# Patient Record
Sex: Male | Born: 1935 | Race: White | Hispanic: No | Marital: Married | Smoking: Former smoker
Health system: Southern US, Community
[De-identification: ages and names within clinical notes are randomized; demographics above are authoritative.]

## PROBLEM LIST (undated history)

## (undated) DIAGNOSIS — J449 Chronic obstructive pulmonary disease, unspecified: Secondary | ICD-10-CM

## (undated) DIAGNOSIS — E785 Hyperlipidemia, unspecified: Secondary | ICD-10-CM

## (undated) DIAGNOSIS — N189 Chronic kidney disease, unspecified: Secondary | ICD-10-CM

## (undated) DIAGNOSIS — E119 Type 2 diabetes mellitus without complications: Secondary | ICD-10-CM

## (undated) DIAGNOSIS — N183 Chronic kidney disease, stage 3 unspecified: Secondary | ICD-10-CM

## (undated) DIAGNOSIS — I10 Essential (primary) hypertension: Secondary | ICD-10-CM

## (undated) DIAGNOSIS — I639 Cerebral infarction, unspecified: Secondary | ICD-10-CM

## (undated) DIAGNOSIS — I4891 Unspecified atrial fibrillation: Secondary | ICD-10-CM

## (undated) DIAGNOSIS — N400 Enlarged prostate without lower urinary tract symptoms: Secondary | ICD-10-CM

## (undated) DIAGNOSIS — J984 Other disorders of lung: Secondary | ICD-10-CM

## (undated) DIAGNOSIS — I429 Cardiomyopathy, unspecified: Secondary | ICD-10-CM

## (undated) DIAGNOSIS — I2510 Atherosclerotic heart disease of native coronary artery without angina pectoris: Secondary | ICD-10-CM

## (undated) DIAGNOSIS — E11319 Type 2 diabetes mellitus with unspecified diabetic retinopathy without macular edema: Secondary | ICD-10-CM

## (undated) DIAGNOSIS — E1122 Type 2 diabetes mellitus with diabetic chronic kidney disease: Secondary | ICD-10-CM

## (undated) HISTORY — DX: Essential (primary) hypertension: I10

## (undated) HISTORY — DX: Cardiomyopathy, unspecified: I42.9

## (undated) HISTORY — DX: Chronic kidney disease, stage 3 (moderate): N18.3

## (undated) HISTORY — DX: Type 2 diabetes mellitus without complications: E11.9

## (undated) HISTORY — PX: CARDIAC DEFIBRILLATOR PLACEMENT: SHX171

## (undated) HISTORY — PX: APPENDECTOMY: SHX54

## (undated) HISTORY — DX: Chronic kidney disease, unspecified: N18.9

## (undated) HISTORY — PX: PACEMAKER INSERTION: SHX728

## (undated) HISTORY — PX: CORONARY ARTERY BYPASS GRAFT: SHX141

## (undated) HISTORY — DX: Hyperlipidemia, unspecified: E78.5

## (undated) HISTORY — DX: Cerebral infarction, unspecified: I63.9

## (undated) HISTORY — DX: Unspecified atrial fibrillation: I48.91

## (undated) HISTORY — DX: Chronic kidney disease, stage 3 unspecified: N18.30

## (undated) HISTORY — DX: Other disorders of lung: J98.4

## (undated) HISTORY — PX: VASECTOMY: SHX75

## (undated) HISTORY — DX: Chronic obstructive pulmonary disease, unspecified: J44.9

## (undated) HISTORY — DX: Atherosclerotic heart disease of native coronary artery without angina pectoris: I25.10

## (undated) HISTORY — DX: Type 2 diabetes mellitus with diabetic chronic kidney disease: E11.22

---

## 2004-04-09 ENCOUNTER — Ambulatory Visit: Payer: Self-pay | Admitting: Cardiology

## 2005-10-14 ENCOUNTER — Other Ambulatory Visit: Payer: Self-pay

## 2005-10-14 ENCOUNTER — Emergency Department: Payer: Self-pay | Admitting: Emergency Medicine

## 2007-09-28 ENCOUNTER — Ambulatory Visit: Payer: Self-pay | Admitting: Gastroenterology

## 2008-01-05 ENCOUNTER — Ambulatory Visit: Payer: Self-pay | Admitting: Family Medicine

## 2009-09-05 ENCOUNTER — Ambulatory Visit: Payer: Self-pay

## 2009-10-10 ENCOUNTER — Ambulatory Visit: Payer: Self-pay

## 2010-06-26 ENCOUNTER — Ambulatory Visit: Payer: Self-pay | Admitting: Gastroenterology

## 2010-10-29 ENCOUNTER — Ambulatory Visit: Payer: Self-pay | Admitting: Gastroenterology

## 2011-01-30 IMAGING — CT CT CHEST W/ CM
1 series · 15 of 33 positions shown, 19 images · non-contrast
Comparison: none

REASON FOR EXAM: ABNORMAL CHEST XRAY
COMMENTS:

[Series 2: soft tissue · axial · 0.90mm/px · z∈[-162,+143]mm · 15 of 73 slices shown, 19 images]
[im 6/73  mediastinal]
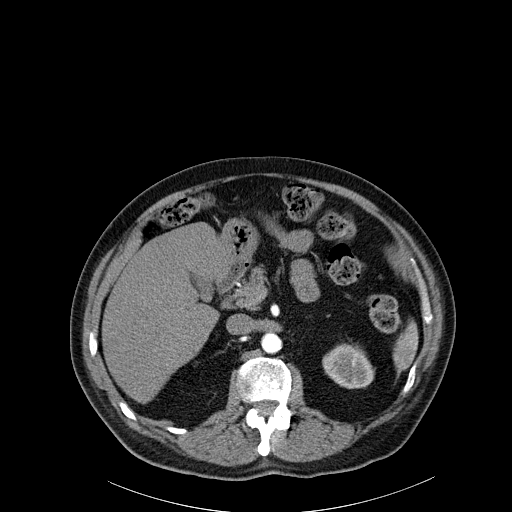
[im 6/73  lung]
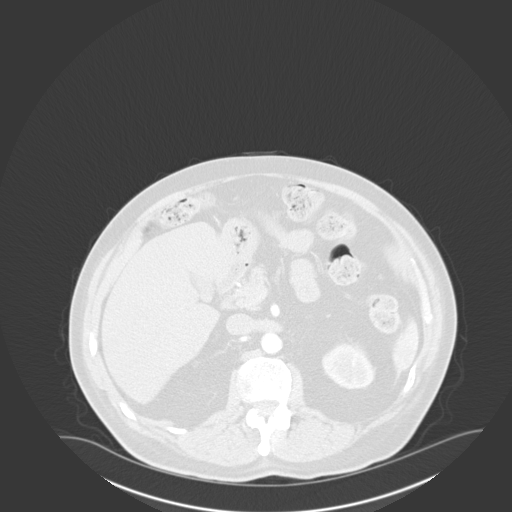
[im 11/73  lung]
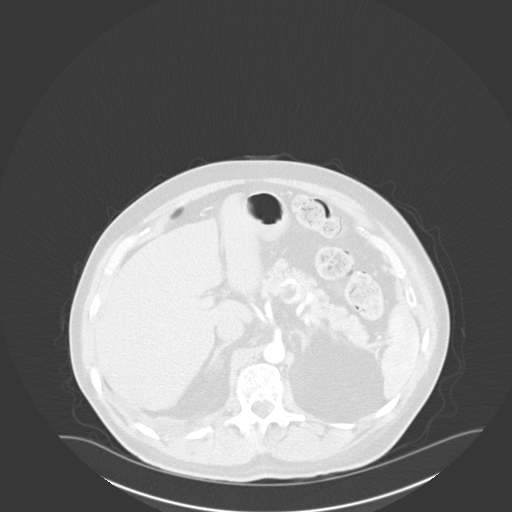
[im 15/73  lung]
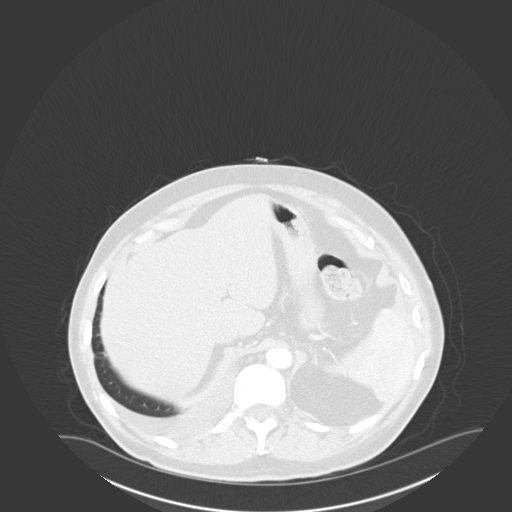
[im 19/73  lung]
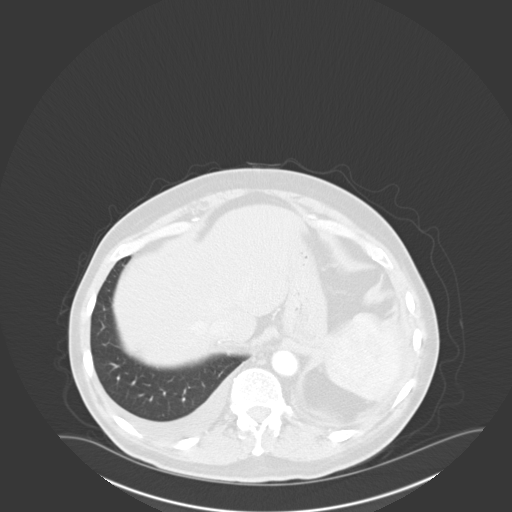
[im 25/73  mediastinal]
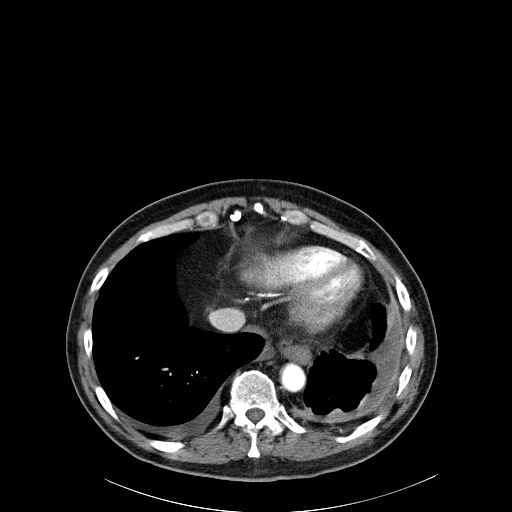
[im 25/73  lung]
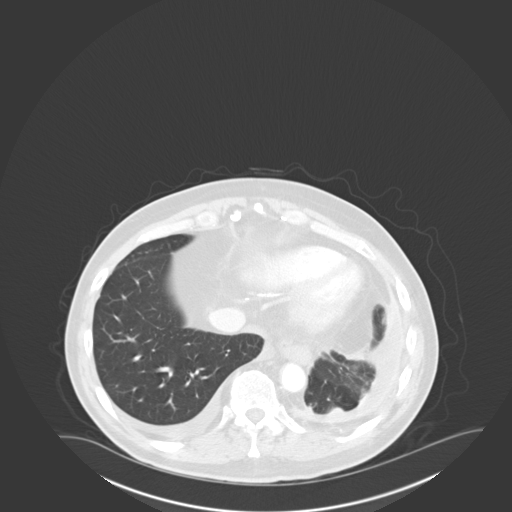
[im 29/73  lung]
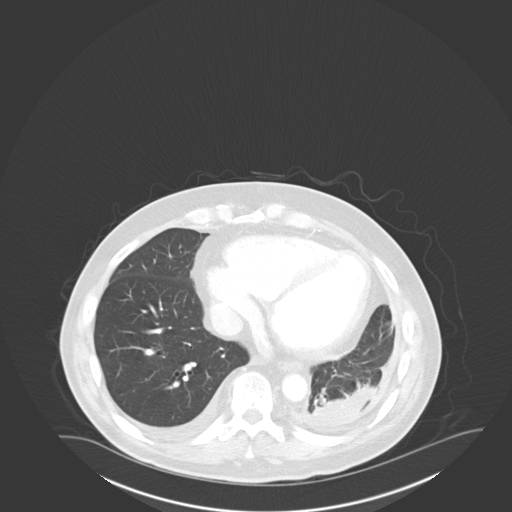
[im 33/73  lung]
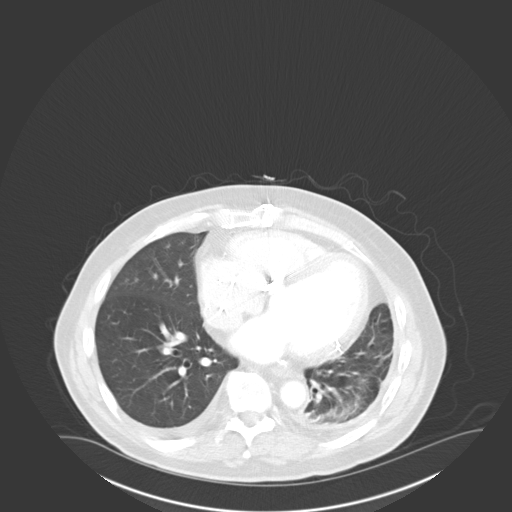
[im 38/73  lung]
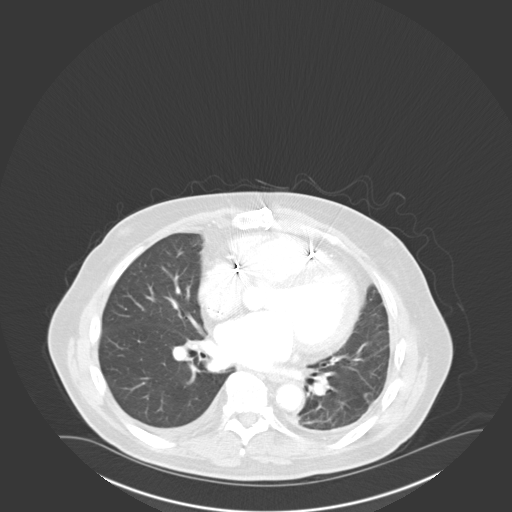
[im 41/73  mediastinal]
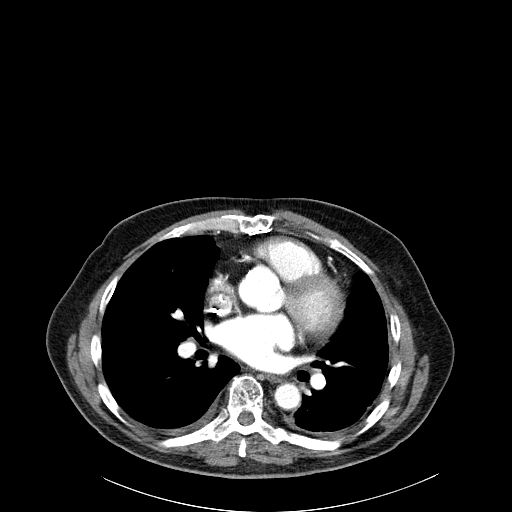
[im 41/73  lung]
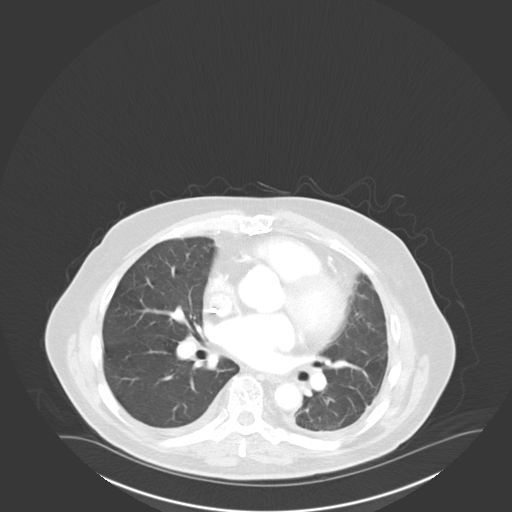
[im 44/73  lung]
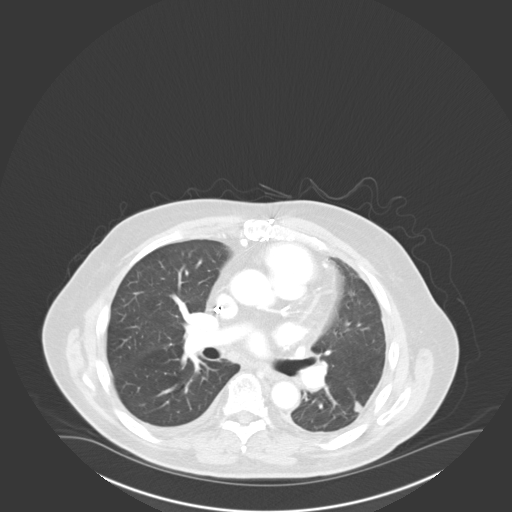
[im 49/73  lung]
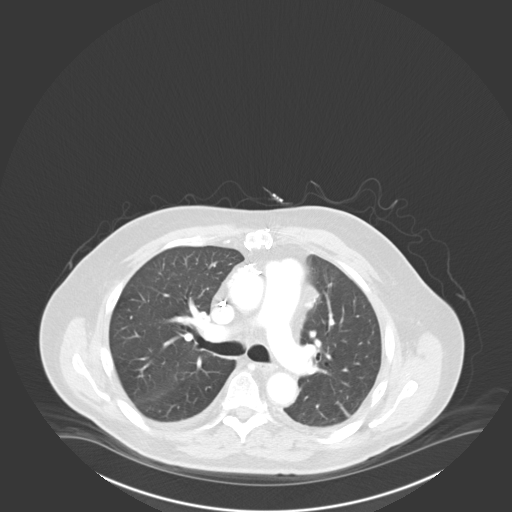
[im 54/73  lung]
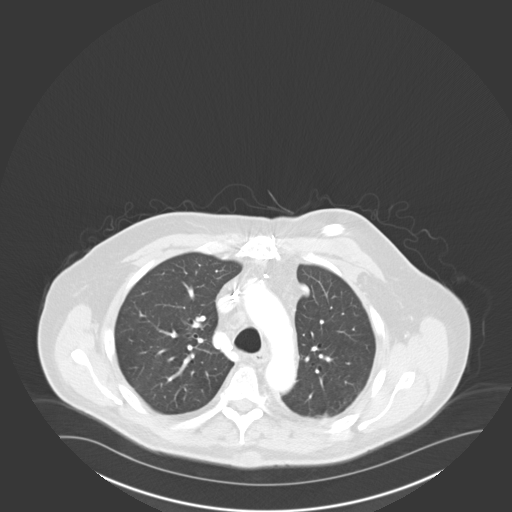
[im 58/73  mediastinal]
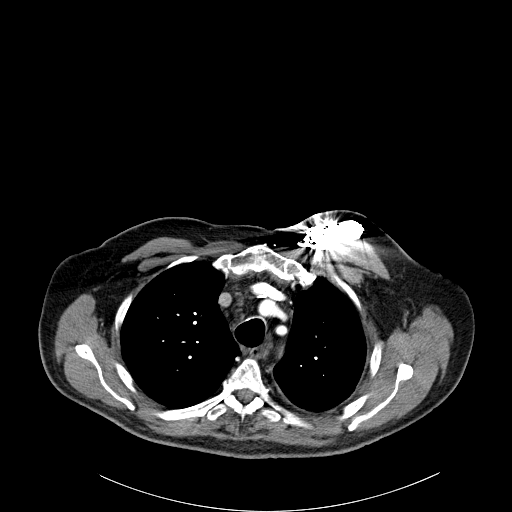
[im 58/73  lung]
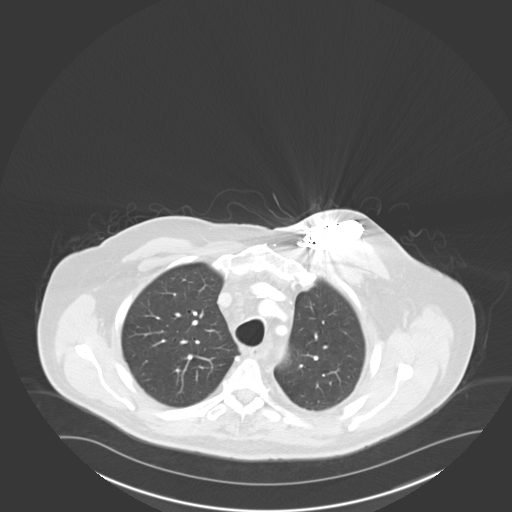
[im 62/73  lung]
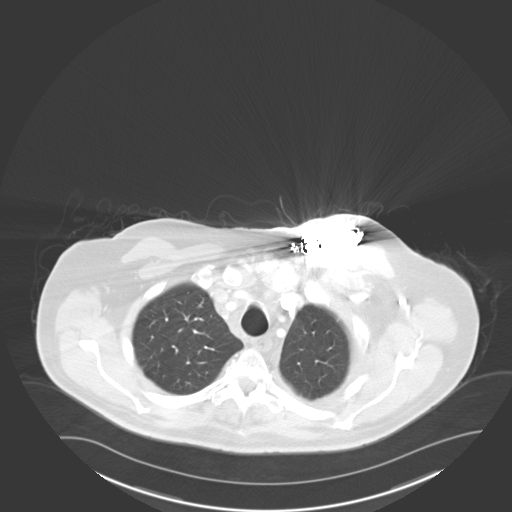
[im 67/73  lung]
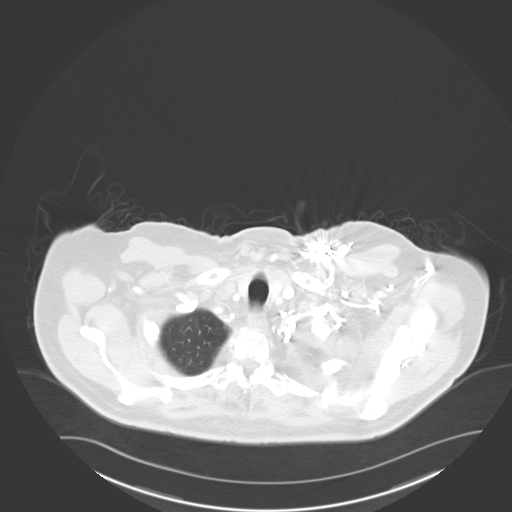

[15 of 33 positions shown; findings below may reference images not displayed]

PROCEDURE:     CT  - CT CHEST WITH CONTRAST  - September 05, 2009 [DATE]

RESULT:     Axial CT scanning was performed through the chest at 5 mm
intervals and slice thicknesses. The patient received 75 cc of Usovue-JP1.
Comparison is made to the report of the [HOSPITAL] chest x-ray 29 August, 2009.

There is abnormal soft tissue density material along the lateral and
posterior pleural surfaces at the left lung base. This suggests
consolidation within the periphery of the left lower lobe with a small
amount of adjacent pleural fluid. An area of pleural based nodular density
is seen on image 30 in the left lower lobe. This measures no more than 1 cm
in greatest dimension. The left upper lobe is clear. The right lung is
adequately inflated and clear. There is a small right pleural effusion
layering posteriorly however.

The cardiac chambers are enlarged. There is a permanent pacemaker in place.
The caliber of the thoracic aorta is normal. There are are a few borderline
enlarged precarinal and AP window lymph nodes identified but there is no
bulky lymphadenopathy.

Within the upper abdomen the observed portions of the liver and gallbladder
appear normal. The spleen is not enlarged. The thoracic vertebral bodies are
preserved in height.
IMPRESSION: 1. There is parenchymal consolidation peripherally in the left lower lobe
with a small left pleural effusion. There is a somewhat larger amount of
pleural fluid layering posteriorly on the right without adjacent pulmonary
parenchymal abnormality.
2. There are a few borderline enlarged mediastinal lymph nodes present.
3. The cardiac chambers are enlarged. There is no pericardial effusion nor
overt evidence of pulmonary interstitial edema. The findings at the left
lung base are likely related to an infectious process but followup imaging
will be needed to assure clearing following therapy. Neoplasm cannot be
dogmatically excluded based on this study.

## 2013-03-31 ENCOUNTER — Other Ambulatory Visit: Payer: Self-pay | Admitting: Family Medicine

## 2014-08-20 ENCOUNTER — Other Ambulatory Visit: Payer: Self-pay | Admitting: Family Medicine

## 2014-08-20 ENCOUNTER — Ambulatory Visit
Admission: RE | Admit: 2014-08-20 | Discharge: 2014-08-20 | Disposition: A | Discharge status: Home / Self Care | Payer: Medicare Other | Source: Ambulatory Visit | Attending: Family Medicine | Admitting: Family Medicine

## 2014-08-20 DIAGNOSIS — R0602 Shortness of breath: Secondary | ICD-10-CM | POA: Diagnosis present

## 2014-08-20 DIAGNOSIS — R059 Cough, unspecified: Secondary | ICD-10-CM

## 2014-08-20 DIAGNOSIS — R0689 Other abnormalities of breathing: Secondary | ICD-10-CM

## 2014-08-20 DIAGNOSIS — R05 Cough: Secondary | ICD-10-CM

## 2014-08-20 DIAGNOSIS — R0989 Other specified symptoms and signs involving the circulatory and respiratory systems: Secondary | ICD-10-CM | POA: Insufficient documentation

## 2014-08-20 DIAGNOSIS — I509 Heart failure, unspecified: Secondary | ICD-10-CM | POA: Insufficient documentation

## 2014-09-28 DIAGNOSIS — I429 Cardiomyopathy, unspecified: Secondary | ICD-10-CM | POA: Insufficient documentation

## 2014-09-28 DIAGNOSIS — E1122 Type 2 diabetes mellitus with diabetic chronic kidney disease: Secondary | ICD-10-CM | POA: Insufficient documentation

## 2014-09-28 DIAGNOSIS — I2510 Atherosclerotic heart disease of native coronary artery without angina pectoris: Secondary | ICD-10-CM | POA: Insufficient documentation

## 2014-09-28 DIAGNOSIS — I10 Essential (primary) hypertension: Secondary | ICD-10-CM | POA: Insufficient documentation

## 2014-09-28 DIAGNOSIS — N183 Chronic kidney disease, stage 3 unspecified: Secondary | ICD-10-CM | POA: Insufficient documentation

## 2014-09-28 DIAGNOSIS — J984 Other disorders of lung: Secondary | ICD-10-CM | POA: Insufficient documentation

## 2014-09-28 DIAGNOSIS — I4891 Unspecified atrial fibrillation: Secondary | ICD-10-CM | POA: Insufficient documentation

## 2014-09-28 DIAGNOSIS — E785 Hyperlipidemia, unspecified: Secondary | ICD-10-CM | POA: Insufficient documentation

## 2014-09-29 ENCOUNTER — Other Ambulatory Visit: Payer: Self-pay | Admitting: Family Medicine

## 2014-10-01 ENCOUNTER — Encounter: Payer: Self-pay | Admitting: Family Medicine

## 2014-10-01 ENCOUNTER — Ambulatory Visit (INDEPENDENT_AMBULATORY_CARE_PROVIDER_SITE_OTHER): Payer: Medicare Other | Admitting: Family Medicine

## 2014-10-01 VITALS — BP 106/67 | HR 60 | Temp 98.5°F | Ht 71.0 in | Wt 193.0 lb

## 2014-10-01 DIAGNOSIS — N183 Chronic kidney disease, stage 3 unspecified: Secondary | ICD-10-CM

## 2014-10-01 DIAGNOSIS — E1122 Type 2 diabetes mellitus with diabetic chronic kidney disease: Secondary | ICD-10-CM

## 2014-10-01 DIAGNOSIS — I2510 Atherosclerotic heart disease of native coronary artery without angina pectoris: Secondary | ICD-10-CM

## 2014-10-01 DIAGNOSIS — I10 Essential (primary) hypertension: Secondary | ICD-10-CM | POA: Diagnosis not present

## 2014-10-01 DIAGNOSIS — I2583 Coronary atherosclerosis due to lipid rich plaque: Principal | ICD-10-CM

## 2014-10-01 NOTE — Assessment & Plan Note (Signed)
After stopping toujeo doing well Will follow glu and call if problems

## 2014-10-01 NOTE — Assessment & Plan Note (Signed)
Reviewed and  The current medical regimen is effective;  continue present plan and medications.

## 2014-10-01 NOTE — Assessment & Plan Note (Signed)
The current medical regimen is effective;  continue present plan and medications.  

## 2014-10-01 NOTE — Progress Notes (Signed)
BP 106/67 mmHg  Pulse 60  Temp(Src) 98.5 F (36.9 C)  Ht 5\' 11"  (1.803 m)  Wt 193 lb (87.544 kg)  BMI 26.93 kg/m2  SpO2 99%   Subjective:    Patient ID: Matthew [name], male    DOB: 1936/03/22, 79 y.o.   MRN: [id number]  HPI: SAMVIT PEOT is a 79 y.o. male  Chief Complaint  Patient presents with  . Diabetes    toujeo issues, patient has stopped taking  pt was gaining wt until stopped toujeo fasting glu has slightly gone up and wt has lost 18 lbs over last 2 weeks record and reviewed on daily log. Pt feeling better Lipids and BP stable Other meds no side effects feeling well Takes every day  Relevant past medical, surgical, family and social history reviewed and updated as indicated. Interim medical history since our last visit reviewed. Allergies and medications reviewed and updated.  Review of Systems  Respiratory: Negative.   Cardiovascular: Negative.     Per HPI unless specifically indicated above     Objective:    BP 106/67 mmHg  Pulse 60  Temp(Src) 98.5 F (36.9 C)  Ht 5\' 11"  (1.803 m)  Wt 193 lb (87.544 kg)  BMI 26.93 kg/m2  SpO2 99%  Wt Readings from Last 3 Encounters:  10/01/14 193 lb (87.544 kg)  08/29/14 209 lb (94.802 kg)    Physical Exam  Constitutional: He is oriented to person, place, and time. He appears well-developed and well-nourished. No distress.  HENT:  Head: Normocephalic and atraumatic.  Right Ear: Hearing normal.  Left Ear: Hearing normal.  Nose: Nose normal.  Eyes: Conjunctivae and lids are normal. Right eye exhibits no discharge. Left eye exhibits no discharge. No scleral icterus.  Cardiovascular: Normal rate and regular rhythm.   Pulmonary/Chest: Effort normal and breath sounds normal. No respiratory distress.  Musculoskeletal: Normal range of motion. He exhibits no edema.  Neurological: He is alert and oriented to person, place, and time.  Skin: Skin is intact. No rash noted.  Psychiatric: He has a normal mood and  affect. His speech is normal and behavior is normal. Judgment and thought content normal. Cognition and memory are normal.        Assessment & Plan:   Problem List Items Addressed This Visit      Cardiovascular and Mediastinum   CAD (coronary artery disease) - Primary    Reviewed and  The current medical regimen is effective;  continue present plan and medications.       Relevant Medications   vitamin C (ASCORBIC ACID) 500 MG tablet   aspirin EC 81 MG tablet   Cholecalciferol (VITAMIN D-1000 MAX ST) 1000 UNITS tablet   vitamin B-12 (CYANOCOBALAMIN) 100 MCG tablet   ferrous sulfate 325 (65 FE) MG tablet   Saw Palmetto 160 MG CAPS   vitamin E 400 UNIT capsule   Hypertension    The current medical regimen is effective;  continue present plan and medications.       Relevant Medications   vitamin C (ASCORBIC ACID) 500 MG tablet   aspirin EC 81 MG tablet   Cholecalciferol (VITAMIN D-1000 MAX ST) 1000 UNITS tablet   vitamin B-12 (CYANOCOBALAMIN) 100 MCG tablet   ferrous sulfate 325 (65 FE) MG tablet   Saw Palmetto 160 MG CAPS   vitamin E 400 UNIT capsule     Endocrine   Type 2 diabetes mellitus with stage 3 chronic kidney disease    After stopping  toujeo doing well Will follow glu and call if problems      Relevant Medications   vitamin C (ASCORBIC ACID) 500 MG tablet   aspirin EC 81 MG tablet   Cholecalciferol (VITAMIN D-1000 MAX ST) 1000 UNITS tablet   vitamin B-12 (CYANOCOBALAMIN) 100 MCG tablet   ferrous sulfate 325 (65 FE) MG tablet   Saw Palmetto 160 MG CAPS   vitamin E 400 UNIT capsule       Follow up plan: Return in about 3 months (around 01/01/2015) for a1c and DM check.

## 2014-10-15 ENCOUNTER — Other Ambulatory Visit: Payer: Self-pay | Admitting: Family Medicine

## 2014-10-29 ENCOUNTER — Other Ambulatory Visit: Payer: Self-pay | Admitting: Family Medicine

## 2014-11-20 ENCOUNTER — Encounter: Payer: Self-pay | Admitting: Family Medicine

## 2014-11-20 ENCOUNTER — Ambulatory Visit (INDEPENDENT_AMBULATORY_CARE_PROVIDER_SITE_OTHER): Payer: Medicare Other | Admitting: Family Medicine

## 2014-11-20 VITALS — BP 97/60 | HR 60 | Temp 98.0°F | Ht 70.7 in | Wt 183.0 lb

## 2014-11-20 DIAGNOSIS — I482 Chronic atrial fibrillation, unspecified: Secondary | ICD-10-CM

## 2014-11-20 DIAGNOSIS — N400 Enlarged prostate without lower urinary tract symptoms: Secondary | ICD-10-CM | POA: Diagnosis not present

## 2014-11-20 DIAGNOSIS — E1122 Type 2 diabetes mellitus with diabetic chronic kidney disease: Secondary | ICD-10-CM

## 2014-11-20 DIAGNOSIS — I2510 Atherosclerotic heart disease of native coronary artery without angina pectoris: Secondary | ICD-10-CM | POA: Diagnosis not present

## 2014-11-20 DIAGNOSIS — N183 Chronic kidney disease, stage 3 unspecified: Secondary | ICD-10-CM

## 2014-11-20 DIAGNOSIS — E785 Hyperlipidemia, unspecified: Secondary | ICD-10-CM | POA: Diagnosis not present

## 2014-11-20 DIAGNOSIS — I10 Essential (primary) hypertension: Secondary | ICD-10-CM | POA: Diagnosis not present

## 2014-11-20 DIAGNOSIS — Z0000 Encounter for general adult medical examination without abnormal findings: Secondary | ICD-10-CM

## 2014-11-20 DIAGNOSIS — I2583 Coronary atherosclerosis due to lipid rich plaque: Secondary | ICD-10-CM

## 2014-11-20 LAB — URINALYSIS, ROUTINE W REFLEX MICROSCOPIC
BILIRUBIN UA: NEGATIVE
GLUCOSE, UA: NEGATIVE
Leukocytes, UA: NEGATIVE
NITRITE UA: NEGATIVE
Protein, UA: NEGATIVE
RBC, UA: NEGATIVE
Specific Gravity, UA: 1.015 (ref 1.005–1.030)
UUROB: 0.2 mg/dL (ref 0.2–1.0)
pH, UA: 7 (ref 5.0–7.5)

## 2014-11-20 LAB — BAYER DCA HB A1C WAIVED: HB A1C: 8.3 % — AB (ref <7.0)

## 2014-11-20 MED ORDER — FINASTERIDE 5 MG PO TABS: 5.0000 mg | ORAL_TABLET | Freq: Every day | ORAL | Status: AC

## 2014-11-20 MED ORDER — INSULIN LISPRO 100 UNIT/ML (KWIKPEN): 8.0000 [IU] | PEN_INJECTOR | Freq: Three times a day (TID) | SUBCUTANEOUS | Status: DC

## 2014-11-20 MED ORDER — COLESEVELAM HCL 625 MG PO TABS: 1875.0000 mg | ORAL_TABLET | Freq: Two times a day (BID) | ORAL | Status: AC

## 2014-11-20 MED ORDER — TELMISARTAN 80 MG PO TABS: 80.0000 mg | ORAL_TABLET | Freq: Every day | ORAL | Status: DC

## 2014-11-20 MED ORDER — EZETIMIBE 10 MG PO TABS: 10.0000 mg | ORAL_TABLET | Freq: Every day | ORAL | Status: AC

## 2014-11-20 MED ORDER — TAMSULOSIN HCL 0.4 MG PO CAPS: 0.4000 mg | ORAL_CAPSULE | Freq: Two times a day (BID) | ORAL | Status: AC

## 2014-11-20 MED ORDER — PANTOPRAZOLE SODIUM 40 MG PO TBEC: 40.0000 mg | DELAYED_RELEASE_TABLET | Freq: Every day | ORAL | Status: DC | PRN

## 2014-11-20 MED ORDER — PRAVASTATIN SODIUM 80 MG PO TABS: 80.0000 mg | ORAL_TABLET | Freq: Every day | ORAL | Status: AC

## 2014-11-20 NOTE — Assessment & Plan Note (Signed)
Patient stable on current medications urology has released patient from further evaluation no further PSA testing.

## 2014-11-20 NOTE — Progress Notes (Signed)
BP 97/60 mmHg  Pulse 60  Temp(Src) 98 F (36.7 C)  Ht 5' 10.7" (1.796 m)  Wt 183 lb (83.008 kg)  BMI 25.73 kg/m2  SpO2 99%   Subjective:    Patient ID: Matthew [name], male    DOB: 02/28/1936, 79 y.o.   MRN: [id number]  HPI: Matthew [name] is a 79 y.o. male  Chief Complaint  Patient presents with  . Annual Exam   patient follow-up doing well no problems with bleeding maybe slight bruising with Eliquis. Other medications doing well no CHF symptoms no edema fluid retention resolved after stopping Toujeo.  Blood sugars doing well Last A1c was 7.8 in May. Fasting blood sugars mid to lower 100s and nonfasting blood sugars mid to higher 100s.  Reflux cholesterol BPH symptoms all stable. Taking medications faithfully with no side effects.  Relevant past medical, surgical, family and social history reviewed and updated as indicated. Interim medical history since our last visit reviewed. Allergies and medications reviewed and updated.  Review of Systems  Constitutional: Negative.   HENT: Negative.   Eyes: Negative.   Respiratory: Negative.   Cardiovascular: Negative.   Endocrine: Negative.   Musculoskeletal: Negative.   Skin: Negative.   Allergic/Immunologic: Negative.   Neurological: Negative.   Hematological: Negative.   Psychiatric/Behavioral: Negative.     Per HPI unless specifically indicated above     Objective:    BP 97/60 mmHg  Pulse 60  Temp(Src) 98 F (36.7 C)  Ht 5' 10.7" (1.796 m)  Wt 183 lb (83.008 kg)  BMI 25.73 kg/m2  SpO2 99%  Wt Readings from Last 3 Encounters:  11/20/14 183 lb (83.008 kg)  10/01/14 193 lb (87.544 kg)  08/29/14 209 lb (94.802 kg)    Physical Exam  Constitutional: He is oriented to person, place, and time. He appears well-developed and well-nourished.  HENT:  Head: Normocephalic and atraumatic.  Right Ear: External ear normal.  Left Ear: External ear normal.  Eyes: Conjunctivae and EOM are normal. Pupils are equal,  round, and reactive to light.  Neck: Normal range of motion. Neck supple.  Cardiovascular: Normal rate, regular rhythm, normal heart sounds and intact distal pulses.   Pulmonary/Chest: Effort normal and breath sounds normal.  Abdominal: Soft. Bowel sounds are normal. There is no splenomegaly or hepatomegaly.  Genitourinary:  GU done at urology  Musculoskeletal: Normal range of motion.  Neurological: He is alert and oriented to person, place, and time. He has normal reflexes.  Skin: No rash noted. No erythema.  Psychiatric: He has a normal mood and affect. His behavior is normal. Judgment and thought content normal.    Results for orders placed or performed in visit on 03/31/13  Protime-INR  Result Value Ref Range   Prothrombin Time 24.8 (H) 11.5-14.7 secs   INR 2.3       Assessment & Plan:   Problem List Items Addressed This Visit      Cardiovascular and Mediastinum   CAD (coronary artery disease)    Followed by cardiology      Relevant Medications   pravastatin (PRAVACHOL) 80 MG tablet   telmisartan (MICARDIS) 80 MG tablet   colesevelam (WELCHOL) 625 MG tablet   ezetimibe (ZETIA) 10 MG tablet   Other Relevant Orders   Comprehensive metabolic panel   CBC with Differential/Platelet   TSH   A-fib - Primary    The current medical regimen is effective;  continue present plan and medications. Followed by cardiology  Relevant Medications   pravastatin (PRAVACHOL) 80 MG tablet   telmisartan (MICARDIS) 80 MG tablet   colesevelam (WELCHOL) 625 MG tablet   ezetimibe (ZETIA) 10 MG tablet   Other Relevant Orders   Comprehensive metabolic panel   CBC with Differential/Platelet   TSH   Hypertension    The current medical regimen is effective;  continue present plan and medications.       Relevant Medications   pravastatin (PRAVACHOL) 80 MG tablet   telmisartan (MICARDIS) 80 MG tablet   colesevelam (WELCHOL) 625 MG tablet   ezetimibe (ZETIA) 10 MG tablet   Other  Relevant Orders   Comprehensive metabolic panel   CBC with Differential/Platelet   TSH     Endocrine   Type 2 diabetes mellitus with stage 3 chronic kidney disease   Relevant Medications   insulin lispro (HUMALOG KWIKPEN) 100 UNIT/ML KiwkPen   pravastatin (PRAVACHOL) 80 MG tablet   telmisartan (MICARDIS) 80 MG tablet   colesevelam (WELCHOL) 625 MG tablet   Other Relevant Orders   Urinalysis, Routine w reflex microscopic (not at [facility])   Bayer DCA Hb A1c Waived   Bayer DCA Hb A1c Waived     Genitourinary   BPH (benign prostatic hyperplasia)    Patient stable on current medications urology has released patient from further evaluation no further PSA testing.      Relevant Medications   tamsulosin (FLOMAX) 0.4 MG CAPS capsule   finasteride (PROSCAR) 5 MG tablet     Other   Hyperlipidemia   Relevant Medications   pravastatin (PRAVACHOL) 80 MG tablet   telmisartan (MICARDIS) 80 MG tablet   colesevelam (WELCHOL) 625 MG tablet   ezetimibe (ZETIA) 10 MG tablet   Other Relevant Orders   Lipid panel    Other Visit Diagnoses    PE (physical exam), annual        Relevant Orders    Comprehensive metabolic panel    CBC with Differential/Platelet    TSH        Follow up plan: Return in about 3 months (around 02/20/2015), or if symptoms worsen or fail to improve, for med check.

## 2014-11-20 NOTE — Assessment & Plan Note (Signed)
Followed by cardiology 

## 2014-11-20 NOTE — Assessment & Plan Note (Signed)
The current medical regimen is effective;  continue present plan and medications.  

## 2014-11-20 NOTE — Assessment & Plan Note (Addendum)
The current medical regimen is effective;  continue present plan and medications. Followed by cardiology. 

## 2014-11-21 ENCOUNTER — Encounter: Payer: Self-pay | Admitting: Family Medicine

## 2014-11-21 LAB — COMPREHENSIVE METABOLIC PANEL
A/G RATIO: 1.8 (ref 1.1–2.5)
ALBUMIN: 4.2 g/dL (ref 3.5–4.8)
ALK PHOS: 171 IU/L — AB (ref 39–117)
ALT: 41 IU/L (ref 0–44)
AST: 32 IU/L (ref 0–40)
BILIRUBIN TOTAL: 1.7 mg/dL — AB (ref 0.0–1.2)
BUN / CREAT RATIO: 21 (ref 10–22)
BUN: 49 mg/dL — ABNORMAL HIGH (ref 8–27)
CHLORIDE: 93 mmol/L — AB (ref 97–108)
CO2: 26 mmol/L (ref 18–29)
Calcium: 9 mg/dL (ref 8.6–10.2)
Creatinine, Ser: 2.37 mg/dL — ABNORMAL HIGH (ref 0.76–1.27)
GFR calc Af Amer: 29 mL/min/{1.73_m2} — ABNORMAL LOW (ref >59)
GFR calc non Af Amer: 25 mL/min/{1.73_m2} — ABNORMAL LOW (ref >59)
GLOBULIN, TOTAL: 2.4 g/dL (ref 1.5–4.5)
Glucose: 227 mg/dL — ABNORMAL HIGH (ref 65–99)
POTASSIUM: 4.8 mmol/L (ref 3.5–5.2)
SODIUM: 136 mmol/L (ref 134–144)
Total Protein: 6.6 g/dL (ref 6.0–8.5)

## 2014-11-21 LAB — CBC WITH DIFFERENTIAL/PLATELET
BASOS: 0 %
Basophils Absolute: 0 10*3/uL (ref 0.0–0.2)
EOS (ABSOLUTE): 0.3 10*3/uL (ref 0.0–0.4)
Eos: 4 %
HEMOGLOBIN: 12.2 g/dL — AB (ref 12.6–17.7)
Hematocrit: 37.9 % (ref 37.5–51.0)
IMMATURE GRANS (ABS): 0 10*3/uL (ref 0.0–0.1)
Immature Granulocytes: 0 %
LYMPHS: 20 %
Lymphocytes Absolute: 1.2 10*3/uL (ref 0.7–3.1)
MCH: 30.2 pg (ref 26.6–33.0)
MCHC: 32.2 g/dL (ref 31.5–35.7)
MCV: 94 fL (ref 79–97)
MONOCYTES: 6 %
Monocytes Absolute: 0.4 10*3/uL (ref 0.1–0.9)
NEUTROS ABS: 4.3 10*3/uL (ref 1.4–7.0)
NEUTROS PCT: 70 %
PLATELETS: 132 10*3/uL — AB (ref 150–379)
RBC: 4.04 x10E6/uL — ABNORMAL LOW (ref 4.14–5.80)
RDW: 15 % (ref 12.3–15.4)
WBC: 6.2 10*3/uL (ref 3.4–10.8)

## 2014-11-21 LAB — LIPID PANEL
CHOLESTEROL TOTAL: 119 mg/dL (ref 100–199)
Chol/HDL Ratio: 2.6 ratio units (ref 0.0–5.0)
HDL: 46 mg/dL (ref >39)
LDL CALC: 56 mg/dL (ref 0–99)
TRIGLYCERIDES: 87 mg/dL (ref 0–149)
VLDL Cholesterol Cal: 17 mg/dL (ref 5–40)

## 2014-11-21 LAB — TSH: TSH: 1.6 u[IU]/mL (ref 0.450–4.500)

## 2015-02-21 ENCOUNTER — Ambulatory Visit (INDEPENDENT_AMBULATORY_CARE_PROVIDER_SITE_OTHER): Payer: Medicare Other | Admitting: Family Medicine

## 2015-02-21 ENCOUNTER — Encounter: Payer: Self-pay | Admitting: Family Medicine

## 2015-02-21 VITALS — BP 83/52 | HR 61 | Temp 97.6°F | Ht 68.0 in | Wt 170.0 lb

## 2015-02-21 DIAGNOSIS — J42 Unspecified chronic bronchitis: Secondary | ICD-10-CM

## 2015-02-21 DIAGNOSIS — N183 Chronic kidney disease, stage 3 unspecified: Secondary | ICD-10-CM

## 2015-02-21 DIAGNOSIS — Z23 Encounter for immunization: Secondary | ICD-10-CM | POA: Diagnosis not present

## 2015-02-21 DIAGNOSIS — I429 Cardiomyopathy, unspecified: Secondary | ICD-10-CM

## 2015-02-21 DIAGNOSIS — E1122 Type 2 diabetes mellitus with diabetic chronic kidney disease: Secondary | ICD-10-CM | POA: Diagnosis not present

## 2015-02-21 DIAGNOSIS — J449 Chronic obstructive pulmonary disease, unspecified: Secondary | ICD-10-CM | POA: Insufficient documentation

## 2015-02-21 DIAGNOSIS — I10 Essential (primary) hypertension: Secondary | ICD-10-CM

## 2015-02-21 LAB — BAYER DCA HB A1C WAIVED: HB A1C: 7.2 % — AB (ref <7.0)

## 2015-02-21 NOTE — Progress Notes (Signed)
BP 83/52 mmHg  Pulse 61  Temp(Src) 97.6 F (36.4 C)  Ht  (1.727 m)  Wt 170 lb (77.111 kg)  BMI 25.85 kg/m2  SpO2 97%   Subjective:    Patient ID: Matthew [name], male    DOB: 10/30/1935, 79 y.o.   MRN: [id number]  HPI: Matthew [name] is a 79 y.o. male  Chief Complaint  Patient presents with  . Diabetes   Patient doing well with diabetes takes 9 units of insulin with each meal blood sugar stays well even all night long blood sugars in the low 100s. No low blood sugar spells. Patient's fasting blood sugar ranges from 80-170 patient's not taking the basal insulin at this point will not use a basal insulin. Congestive heart failure being managed by cardiology along with atrial fibrillation patient's been on 2 different diuretics has lost 23 pounds Patient still has some ankle and stomach swelling. Patient's able to sleep flat without orthostatic changes or orthopnea.  Patient's blood pressure noted to be very low today is also noticed a very slow and weak stream of urine with very little urine being produced. Patient stopped both Demadex and Zaroxolyn today. Wants some direction on Wednesday use again. Relevant past medical, surgical, family and social history reviewed and updated as indicated. Interim medical history since our last visit reviewed. Allergies and medications reviewed and updated.  Review of Systems  Constitutional: Positive for fatigue. Negative for fever.  Respiratory: Positive for cough and shortness of breath. Negative for chest tightness.   Cardiovascular: Positive for palpitations and leg swelling. Negative for chest pain.  Gastrointestinal: Positive for abdominal distention.    Per HPI unless specifically indicated above     Objective:    BP 83/52 mmHg  Pulse 61  Temp(Src) 97.6 F (36.4 C)  Ht  (1.727 m)  Wt 170 lb (77.111 kg)  BMI 25.85 kg/m2  SpO2 97%  Wt Readings from Last 3 Encounters:  02/21/15 170 lb (77.111 kg)  11/20/14  183 lb (83.008 kg)  10/01/14 193 lb (87.544 kg)    Physical Exam  Constitutional: He is oriented to person, place, and time. He appears well-developed and well-nourished. No distress.  HENT:  Head: Normocephalic and atraumatic.  Right Ear: Hearing normal.  Left Ear: Hearing normal.  Nose: Nose normal.  Eyes: Conjunctivae and lids are normal. Right eye exhibits no discharge. Left eye exhibits no discharge. No scleral icterus.  Cardiovascular: Normal rate, regular rhythm and normal heart sounds.   Pulmonary/Chest: Effort normal and breath sounds normal. No respiratory distress. He has no wheezes. He has no rales. He exhibits no tenderness.  No rales appreciated  Musculoskeletal: Normal range of motion.  Neurological: He is alert and oriented to person, place, and time.  Skin: Skin is intact. No rash noted.  Psychiatric: He has a normal mood and affect. His speech is normal and behavior is normal. Judgment and thought content normal. Cognition and memory are normal.        Assessment & Plan:   Problem List Items Addressed This Visit      Cardiovascular and Mediastinum   Hypertension    Patient with hypotension now most likely secondary to dehydration Gave patient written directions to cut Micardis in half and take 40 mg a day We will check blood pressure may need to go back on higher dose his fluid status recovers      Relevant Medications   metolazone (ZAROXOLYN) 5 MG tablet   Cardiomyopathy (  HCC)    Patient's CHF stable with marked weight loss and diminution of edema Patient now apparently decompensating with dehydration. Patient holding fluid pills will increase water Patient will weigh each morning trying to hold current weight Weight goes up patient will start back on his fluid pills Patient will check with Dr. Rushie GoltzFaith for further directions.      Relevant Medications   metolazone (ZAROXOLYN) 5 MG tablet     Respiratory   COPD (chronic obstructive pulmonary disease)  (HCC)     Endocrine   Type 2 diabetes mellitus with stage 3 chronic kidney disease (HCC)    Stable on current insulin dose will not make any changes       Other Visit Diagnoses    Immunization due    -  Primary    Relevant Orders    Flu Vaccine QUAD 36+ mos PF IM (Fluarix & Fluzone Quad PF) (Completed)        Follow up plan: Return in about 3 months (around 05/24/2015) for Physical Exam and a1c.

## 2015-02-21 NOTE — Assessment & Plan Note (Signed)
Patient's CHF stable with marked weight loss and diminution of edema Patient now apparently decompensating with dehydration. Patient holding fluid pills will increase water Patient will weigh each morning trying to hold current weight Weight goes up patient will start back on his fluid pills Patient will check with Dr. Rushie GoltzFaith for further directions.

## 2015-02-21 NOTE — Assessment & Plan Note (Signed)
Stable on current insulin dose will not make any changes

## 2015-02-21 NOTE — Assessment & Plan Note (Signed)
Patient with hypotension now most likely secondary to dehydration Gave patient written directions to cut Micardis in half and take 40 mg a day We will check blood pressure may need to go back on higher dose his fluid status recovers

## 2015-04-09 ENCOUNTER — Other Ambulatory Visit: Payer: Self-pay | Admitting: Family Medicine

## 2015-05-27 ENCOUNTER — Ambulatory Visit (INDEPENDENT_AMBULATORY_CARE_PROVIDER_SITE_OTHER): Payer: PPO | Admitting: Family Medicine

## 2015-05-27 ENCOUNTER — Encounter: Payer: Self-pay | Admitting: Family Medicine

## 2015-05-27 VITALS — BP 90/56 | HR 61 | Temp 97.7°F | Ht 70.5 in | Wt 183.0 lb

## 2015-05-27 DIAGNOSIS — E1122 Type 2 diabetes mellitus with diabetic chronic kidney disease: Secondary | ICD-10-CM

## 2015-05-27 DIAGNOSIS — I482 Chronic atrial fibrillation, unspecified: Secondary | ICD-10-CM

## 2015-05-27 DIAGNOSIS — N390 Urinary tract infection, site not specified: Secondary | ICD-10-CM | POA: Insufficient documentation

## 2015-05-27 DIAGNOSIS — Z794 Long term (current) use of insulin: Secondary | ICD-10-CM | POA: Diagnosis not present

## 2015-05-27 DIAGNOSIS — N400 Enlarged prostate without lower urinary tract symptoms: Secondary | ICD-10-CM | POA: Diagnosis not present

## 2015-05-27 DIAGNOSIS — Z0000 Encounter for general adult medical examination without abnormal findings: Secondary | ICD-10-CM

## 2015-05-27 DIAGNOSIS — I10 Essential (primary) hypertension: Secondary | ICD-10-CM

## 2015-05-27 DIAGNOSIS — N183 Chronic kidney disease, stage 3 (moderate): Secondary | ICD-10-CM

## 2015-05-27 DIAGNOSIS — Z23 Encounter for immunization: Secondary | ICD-10-CM

## 2015-05-27 DIAGNOSIS — I2510 Atherosclerotic heart disease of native coronary artery without angina pectoris: Secondary | ICD-10-CM

## 2015-05-27 DIAGNOSIS — H6123 Impacted cerumen, bilateral: Secondary | ICD-10-CM

## 2015-05-27 DIAGNOSIS — I429 Cardiomyopathy, unspecified: Secondary | ICD-10-CM

## 2015-05-27 DIAGNOSIS — N3001 Acute cystitis with hematuria: Secondary | ICD-10-CM | POA: Diagnosis not present

## 2015-05-27 DIAGNOSIS — J42 Unspecified chronic bronchitis: Secondary | ICD-10-CM

## 2015-05-27 DIAGNOSIS — I2583 Coronary atherosclerosis due to lipid rich plaque: Secondary | ICD-10-CM

## 2015-05-27 LAB — URINALYSIS, ROUTINE W REFLEX MICROSCOPIC
Bilirubin, UA: NEGATIVE
Ketones, UA: NEGATIVE
Nitrite, UA: POSITIVE — AB
PH UA: 6.5 (ref 5.0–7.5)
Protein, UA: NEGATIVE
SPEC GRAV UA: 1.015 (ref 1.005–1.030)
Urobilinogen, Ur: 0.2 mg/dL (ref 0.2–1.0)

## 2015-05-27 LAB — MICROSCOPIC EXAMINATION: WBC, UA: >30 /hpf — AB (ref 0–?)

## 2015-05-27 LAB — BAYER DCA HB A1C WAIVED: HB A1C (BAYER DCA - WAIVED): 9.9 % — ABNORMAL HIGH (ref <7.0)

## 2015-05-27 MED ORDER — CIPROFLOXACIN HCL 250 MG PO TABS: 250.0000 mg | ORAL_TABLET | Freq: Two times a day (BID) | ORAL | Status: DC

## 2015-05-27 NOTE — Assessment & Plan Note (Addendum)
Discussed diabetes poor control increasing insulin Recheck one month to assess process

## 2015-05-27 NOTE — Assessment & Plan Note (Signed)
The current medical regimen is effective;  continue present plan and medications.  

## 2015-05-27 NOTE — Assessment & Plan Note (Signed)
Patient with only minimal symptoms had been working with urology but is not seeing his urologist recently Will get urine culture and sensitivity Start Cipro recheck urinalysis next month at office visit

## 2015-05-27 NOTE — Assessment & Plan Note (Signed)
Followed by cardiology 

## 2015-05-27 NOTE — Progress Notes (Signed)
BP 90/56 mmHg  Pulse 61  Temp(Src) 97.7 F (36.5 C)  Ht 5' 10.5" (1.791 m)  Wt 183 lb (83.008 kg)  BMI 25.88 kg/m2  SpO2 99%   Subjective:    Patient ID: Matthew [name], male    DOB: [birth year], 80 y.o.   MRN: [id number]  HPI: Matthew [name] is a 80 y.o. male  Chief Complaint  Patient presents with  . Annual Exam    with both side  hearing loss ears stopped up Home checks of glucose 200 or so has stopped that increasing insulin to 11 units with meals served about getting too high on his insulin dosing Cholesterol blood pressure all stable cardiologist aware of intermittent atrial fibrillations Taking Eliquis without problems no bleeding issues Relevant past medical, surgical, family and social history reviewed and updated as indicated. Interim medical history since our last visit reviewed. Allergies and medications reviewed and updated.  Review of Systems  Constitutional: Negative.   HENT: Negative.   Eyes: Negative.   Respiratory: Negative.   Cardiovascular: Negative.   Gastrointestinal: Negative.   Endocrine: Negative.   Genitourinary: Negative.   Musculoskeletal: Negative.   Skin: Negative.   Allergic/Immunologic: Negative.   Neurological: Negative.   Hematological: Negative.   Psychiatric/Behavioral: Negative.     Per HPI unless specifically indicated above     Objective:    BP 90/56 mmHg  Pulse 61  Temp(Src) 97.7 F (36.5 C)  Ht 5' 10.5" (1.791 m)  Wt 183 lb (83.008 kg)  BMI 25.88 kg/m2  SpO2 99%  Wt Readings from Last 3 Encounters:  05/27/15 183 lb (83.008 kg)  02/21/15 170 lb (77.111 kg)  11/20/14 183 lb (83.008 kg)    Physical Exam  Constitutional: He is oriented to person, place, and time. He appears well-developed and well-nourished.  HENT:  Head: Normocephalic and atraumatic.  Right Ear: External ear normal.  Left Ear: External ear normal.  Both ears blocked with wax removed with water and instruments revealing normal canal and TM  resolution of patient's symptoms of stopped up ears  Eyes: Conjunctivae and EOM are normal. Pupils are equal, round, and reactive to light.  Neck: Normal range of motion. Neck supple.  Cardiovascular: Normal rate, regular rhythm, normal heart sounds and intact distal pulses.   Pulmonary/Chest: Effort normal and breath sounds normal.  Abdominal: Soft. Bowel sounds are normal. There is no splenomegaly or hepatomegaly.  Genitourinary: Rectum normal, prostate normal and penis normal.  Musculoskeletal: Normal range of motion.  Neurological: He is alert and oriented to person, place, and time. He has normal reflexes.  Skin: No rash noted. No erythema.  Psychiatric: He has a normal mood and affect. His behavior is normal. Judgment and thought content normal.    Results for orders placed or performed in visit on 02/21/15  Bayer DCA Hb A1c Waived  Result Value Ref Range   Bayer DCA Hb A1c Waived 7.2 (H) <7.0 %      Assessment & Plan:   Problem List Items Addressed This Visit      Cardiovascular and Mediastinum   Hypertension    The current medical regimen is effective;  continue present plan and medications.       Relevant Orders   Comprehensive metabolic panel   TSH   Cardiomyopathy (HCC)    Followed by cardiology      Relevant Orders   Comprehensive metabolic panel   TSH   CAD (coronary artery disease)    Followed by cardiology  Relevant Orders   Lipid panel   CBC with Differential/Platelet   TSH   Urinalysis, Routine w reflex microscopic (not at [facility])   A-fib [facility])    Followed by cardiology      Relevant Orders   TSH     Respiratory   COPD (chronic obstructive pulmonary disease) (HCC)    The current medical regimen is effective;  continue present plan and medications.       Relevant Orders   TSH     Endocrine   Type 2 diabetes mellitus with stage 3 chronic kidney disease (HCC)    Discussed diabetes poor control increasing insulin Recheck one month to  assess process       Relevant Orders   Bayer DCA Hb A1c Waived   Comprehensive metabolic panel   CBC with Differential/Platelet   TSH   Urinalysis, Routine w reflex microscopic (not at [facility])     Genitourinary   Urinary tract infection    Patient with only minimal symptoms had been working with urology but is not seeing his urologist recently Will get urine culture and sensitivity Start Cipro recheck urinalysis next month at office visit      BPH (benign prostatic hyperplasia)    Other Visit Diagnoses    Immunization due    -  Primary    Relevant Orders    Pneumococcal conjugate vaccine 13-valent IM (Completed)    PE (physical exam), annual        Cerumen impaction, bilateral        wax removed both ears        Follow up plan: Return in about 4 weeks (around 06/24/2015) for One month check urinalysis 3 months A1 C.

## 2015-05-28 ENCOUNTER — Encounter: Payer: Self-pay | Admitting: Family Medicine

## 2015-05-28 DIAGNOSIS — D631 Anemia in chronic kidney disease: Secondary | ICD-10-CM | POA: Insufficient documentation

## 2015-05-28 DIAGNOSIS — I5040 Unspecified combined systolic (congestive) and diastolic (congestive) heart failure: Secondary | ICD-10-CM

## 2015-05-28 DIAGNOSIS — I130 Hypertensive heart and chronic kidney disease with heart failure and stage 1 through stage 4 chronic kidney disease, or unspecified chronic kidney disease: Secondary | ICD-10-CM | POA: Insufficient documentation

## 2015-05-28 DIAGNOSIS — N184 Chronic kidney disease, stage 4 (severe): Secondary | ICD-10-CM

## 2015-05-28 DIAGNOSIS — N189 Chronic kidney disease, unspecified: Secondary | ICD-10-CM

## 2015-05-28 LAB — CBC WITH DIFFERENTIAL/PLATELET
BASOS: 0 %
Basophils Absolute: 0 10*3/uL (ref 0.0–0.2)
EOS (ABSOLUTE): 0.4 10*3/uL (ref 0.0–0.4)
EOS: 5 %
HEMATOCRIT: 36 % — AB (ref 37.5–51.0)
Hemoglobin: 11.8 g/dL — ABNORMAL LOW (ref 12.6–17.7)
IMMATURE GRANULOCYTES: 0 %
Immature Grans (Abs): 0 10*3/uL (ref 0.0–0.1)
LYMPHS ABS: 1 10*3/uL (ref 0.7–3.1)
Lymphs: 13 %
MCH: 31 pg (ref 26.6–33.0)
MCHC: 32.8 g/dL (ref 31.5–35.7)
MCV: 95 fL (ref 79–97)
MONOS ABS: 0.6 10*3/uL (ref 0.1–0.9)
Monocytes: 9 %
NEUTROS ABS: 5.4 10*3/uL (ref 1.4–7.0)
NEUTROS PCT: 73 %
PLATELETS: 150 10*3/uL (ref 150–379)
RBC: 3.81 x10E6/uL — ABNORMAL LOW (ref 4.14–5.80)
RDW: 15.9 % — AB (ref 12.3–15.4)
WBC: 7.4 10*3/uL (ref 3.4–10.8)

## 2015-05-28 LAB — COMPREHENSIVE METABOLIC PANEL
ALBUMIN: 4.3 g/dL (ref 3.5–4.8)
ALK PHOS: 186 IU/L — AB (ref 39–117)
ALT: 30 IU/L (ref 0–44)
AST: 28 IU/L (ref 0–40)
Albumin/Globulin Ratio: 1.5 (ref 1.1–2.5)
BILIRUBIN TOTAL: 2.2 mg/dL — AB (ref 0.0–1.2)
BUN / CREAT RATIO: 26 — AB (ref 10–22)
BUN: 62 mg/dL — AB (ref 8–27)
CHLORIDE: 87 mmol/L — AB (ref 96–106)
CO2: 28 mmol/L (ref 18–29)
CREATININE: 2.39 mg/dL — AB (ref 0.76–1.27)
Calcium: 9.5 mg/dL (ref 8.6–10.2)
GFR calc Af Amer: 29 mL/min/{1.73_m2} — ABNORMAL LOW (ref >59)
GFR calc non Af Amer: 25 mL/min/{1.73_m2} — ABNORMAL LOW (ref >59)
GLUCOSE: 215 mg/dL — AB (ref 65–99)
Globulin, Total: 2.9 g/dL (ref 1.5–4.5)
Potassium: 4.4 mmol/L (ref 3.5–5.2)
Sodium: 134 mmol/L (ref 134–144)
Total Protein: 7.2 g/dL (ref 6.0–8.5)

## 2015-05-28 LAB — LIPID PANEL
CHOLESTEROL TOTAL: 129 mg/dL (ref 100–199)
Chol/HDL Ratio: 2.4 ratio units (ref 0.0–5.0)
HDL: 54 mg/dL (ref >39)
LDL CALC: 61 mg/dL (ref 0–99)
TRIGLYCERIDES: 72 mg/dL (ref 0–149)
VLDL CHOLESTEROL CAL: 14 mg/dL (ref 5–40)

## 2015-05-28 LAB — TSH: TSH: 3.27 u[IU]/mL (ref 0.450–4.500)

## 2015-05-29 LAB — URINE CULTURE: ORGANISM ID, BACTERIA: NO GROWTH

## 2015-06-03 ENCOUNTER — Telehealth: Payer: Self-pay

## 2015-06-03 NOTE — Telephone Encounter (Signed)
We should write another one.  Which should I write for?

## 2015-06-03 NOTE — Telephone Encounter (Signed)
A prior authorization came in for contour next strips.  Would you want to change the type of test strips or continue prior authorrzation?

## 2015-06-04 NOTE — Telephone Encounter (Signed)
Never do prior authorization for tress strips. We don't care only the patient's insurance company and the patient's [employer] care. We will use whatever test strips they want we tried to prescribe generic test strips

## 2015-06-04 NOTE — Telephone Encounter (Signed)
MAC's patient

## 2015-06-04 NOTE — Telephone Encounter (Signed)
I'm not sure. Dr. Laural Benes has had to switch strips before, so if you'd would like to ask her.   I think it's generic blood glucose strips or something.

## 2015-06-04 NOTE — Telephone Encounter (Signed)
Great information! I'll take that into note from now on. Thank you!

## 2015-06-06 ENCOUNTER — Other Ambulatory Visit: Payer: Self-pay | Admitting: Family Medicine

## 2015-06-06 MED ORDER — BLOOD GLUCOSE TEST VI STRP: 1.0000 | ORAL_STRIP | Freq: Every day | Status: DC

## 2015-06-24 ENCOUNTER — Ambulatory Visit (INDEPENDENT_AMBULATORY_CARE_PROVIDER_SITE_OTHER): Payer: PPO | Admitting: Family Medicine

## 2015-06-24 ENCOUNTER — Encounter: Payer: Self-pay | Admitting: Family Medicine

## 2015-06-24 VITALS — BP 94/60 | HR 60 | Temp 96.3°F | Ht 70.0 in | Wt 184.0 lb

## 2015-06-24 DIAGNOSIS — I482 Chronic atrial fibrillation, unspecified: Secondary | ICD-10-CM

## 2015-06-24 DIAGNOSIS — N183 Chronic kidney disease, stage 3 (moderate): Secondary | ICD-10-CM

## 2015-06-24 DIAGNOSIS — I429 Cardiomyopathy, unspecified: Secondary | ICD-10-CM

## 2015-06-24 DIAGNOSIS — E1122 Type 2 diabetes mellitus with diabetic chronic kidney disease: Secondary | ICD-10-CM

## 2015-06-24 DIAGNOSIS — N3000 Acute cystitis without hematuria: Secondary | ICD-10-CM | POA: Diagnosis not present

## 2015-06-24 DIAGNOSIS — I10 Essential (primary) hypertension: Secondary | ICD-10-CM | POA: Diagnosis not present

## 2015-06-24 DIAGNOSIS — Z794 Long term (current) use of insulin: Secondary | ICD-10-CM | POA: Diagnosis not present

## 2015-06-24 MED ORDER — INSULIN ASPART 100 UNIT/ML FLEXPEN: 15.0000 [IU] | PEN_INJECTOR | Freq: Three times a day (TID) | SUBCUTANEOUS | Status: DC

## 2015-06-24 NOTE — Assessment & Plan Note (Signed)
Discussed diabetes care and treatment will switch to NovoLog same dose and observe

## 2015-06-24 NOTE — Assessment & Plan Note (Signed)
The current medical regimen is effective;  continue present plan and medications.  

## 2015-06-24 NOTE — Progress Notes (Signed)
BP 94/60 mmHg  Pulse 60  Temp(Src) 96.3 F (35.7 C)  Ht  (1.778 m)  Wt 184 lb (83.462 kg)  BMI 26.40 kg/m2  SpO2 97%   Subjective:    Patient ID: Matthew [name], male    DOB: 05/01/1935, 80 y.o.   MRN: [id number]  HPI: Matthew [name] is a 80 y.o. male  Chief Complaint  Patient presents with  . UTI follow up   She has been using 15 units of Humalog with meals is concerned about having he gets many shots but doing okay with blood sugars in the 100 range up to 200. Had one episode of 26 which he felt low but otherwise is done well. Insurance wants to change from Humalog to NovoLog Reviewed insulin history patient had been on Levemir but insurance stopped that patient was well controlled on rapid acting and long-acting insulin. May need to restart pending next A1c. Patient with no bowel bladder symptoms urinary tract symptoms blood in stool or urine no frequency urgency dysuria  Relevant past medical, surgical, family and social history reviewed and updated as indicated. Interim medical history since our last visit reviewed. Allergies and medications reviewed and updated.  Review of Systems  Constitutional: Negative.   Respiratory: Negative.   Gastrointestinal: Negative.   Genitourinary: Negative.     Per HPI unless specifically indicated above     Objective:    BP 94/60 mmHg  Pulse 60  Temp(Src) 96.3 F (35.7 C)  Ht  (1.778 m)  Wt 184 lb (83.462 kg)  BMI 26.40 kg/m2  SpO2 97%  Wt Readings from Last 3 Encounters:  06/24/15 184 lb (83.462 kg)  05/27/15 183 lb (83.008 kg)  02/21/15 170 lb (77.111 kg)    Physical Exam  Constitutional: He is oriented to person, place, and time. He appears well-developed and well-nourished. No distress.  HENT:  Head: Normocephalic and atraumatic.  Right Ear: Hearing normal.  Left Ear: Hearing normal.  Nose: Nose normal.  Eyes: Conjunctivae and lids are normal. Right eye exhibits no discharge. Left eye exhibits  no discharge. No scleral icterus.  Cardiovascular: Normal rate, regular rhythm and normal heart sounds.   Pulmonary/Chest: Effort normal and breath sounds normal. No respiratory distress.  Musculoskeletal: Normal range of motion.  Neurological: He is alert and oriented to person, place, and time.  Skin: Skin is intact. No rash noted.  Psychiatric: He has a normal mood and affect. His speech is normal and behavior is normal. Judgment and thought content normal. Cognition and memory are normal.    Results for orders placed or performed in visit on 05/27/15  Urine culture  Result Value Ref Range   Urine Culture, Routine Final report    Urine Culture result 1 No growth   Microscopic Examination  Result Value Ref Range   WBC, UA >30 (A) 0 -  5 /hpf   RBC, UA 3-10 (A) 0 -  2 /hpf   Epithelial Cells (non renal) 0-10 0 - 10 /hpf   Bacteria, UA Few None seen/Few  Bayer DCA Hb A1c Waived  Result Value Ref Range   Bayer DCA Hb A1c Waived 9.9 (H) <7.0 %  Comprehensive metabolic panel  Result Value Ref Range   Glucose 215 (H) 65 - 99 mg/dL   BUN 62 (H) 8 - 27 mg/dL   Creatinine, Ser 4.09 (H) 0.76 - 1.27 mg/dL   GFR calc non Af Amer 25 (L) >59 mL/min/1.73   GFR calc Af  Amer 29 (L) >59 mL/min/1.73   BUN/Creatinine Ratio 26 (H) 10 - 22   Sodium 134 134 - 144 mmol/L   Potassium 4.4 3.5 - 5.2 mmol/L   Chloride 87 (L) 96 - 106 mmol/L   CO2 28 18 - 29 mmol/L   Calcium 9.5 8.6 - 10.2 mg/dL   Total Protein 7.2 6.0 - 8.5 g/dL   Albumin 4.3 3.5 - 4.8 g/dL   Globulin, Total 2.9 1.5 - 4.5 g/dL   Albumin/Globulin Ratio 1.5 1.1 - 2.5   Bilirubin Total 2.2 (H) 0.0 - 1.2 mg/dL   Alkaline Phosphatase 186 (H) 39 - 117 IU/L   AST 28 0 - 40 IU/L   ALT 30 0 - 44 IU/L  Lipid panel  Result Value Ref Range   Cholesterol, Total 129 100 - 199 mg/dL   Triglycerides 72 0 - 149 mg/dL   HDL 54 >16 mg/dL   VLDL Cholesterol Cal 14 5 - 40 mg/dL   LDL Calculated 61 0 - 99 mg/dL   Chol/HDL Ratio 2.4 0.0 - 5.0  ratio units  CBC with Differential/Platelet  Result Value Ref Range   WBC 7.4 3.4 - 10.8 x10E3/uL   RBC 3.81 (L) 4.14 - 5.80 x10E6/uL   Hemoglobin 11.8 (L) 12.6 - 17.7 g/dL   Hematocrit 10.9 (L) 60.4 - 51.0 %   MCV 95 79 - 97 fL   MCH 31.0 26.6 - 33.0 pg   MCHC 32.8 31.5 - 35.7 g/dL   RDW 54.0 (H) 98.1 - 19.1 %   Platelets 150 150 - 379 x10E3/uL   Neutrophils 73 %   Lymphs 13 %   Monocytes 9 %   Eos 5 %   Basos 0 %   Neutrophils Absolute 5.4 1.4 - 7.0 x10E3/uL   Lymphocytes Absolute 1.0 0.7 - 3.1 x10E3/uL   Monocytes Absolute 0.6 0.1 - 0.9 x10E3/uL   EOS (ABSOLUTE) 0.4 0.0 - 0.4 x10E3/uL   Basophils Absolute 0.0 0.0 - 0.2 x10E3/uL   Immature Granulocytes 0 %   Immature Grans (Abs) 0.0 0.0 - 0.1 x10E3/uL  TSH  Result Value Ref Range   TSH 3.270 0.450 - 4.500 uIU/mL  Urinalysis, Routine w reflex microscopic (not at [facility])  Result Value Ref Range   Specific Gravity, UA 1.015 1.005 - 1.030   pH, UA 6.5 5.0 - 7.5   Color, UA Yellow Yellow   Appearance Ur Cloudy (A) Clear   Leukocytes, UA 3+ (A) Negative   Protein, UA Negative Negative/Trace   Glucose, UA Trace (A) Negative   Ketones, UA Negative Negative   RBC, UA 1+ (A) Negative   Bilirubin, UA Negative Negative   Urobilinogen, Ur 0.2 0.2 - 1.0 mg/dL   Nitrite, UA Positive (A) Negative   Microscopic Examination See below:       Assessment & Plan:   Problem List Items Addressed This Visit      Cardiovascular and Mediastinum   A-fib (HCC)   Cardiomyopathy (HCC)    Followed by cardfiology      Hypertension    The current medical regimen is effective;  continue present plan and medications.         Endocrine   Type 2 diabetes mellitus with stage 3 chronic kidney disease (HCC)    Discussed diabetes care and treatment will switch to NovoLog same dose and observe      Relevant Medications   insulin aspart (NOVOLOG) 100 UNIT/ML FlexPen     Genitourinary   Urinary tract infection -  Primary   Relevant Orders    Urinalysis, Routine w reflex microscopic ([facility])     patient with no urinary symptoms unable to produce specimen did not want to take home urine cup Will observe for now.  Follow up plan: Return in about 2 months (around 08/24/2015) for A1c.

## 2015-06-24 NOTE — Assessment & Plan Note (Signed)
Followed by cardfiology

## 2015-06-25 NOTE — Addendum Note (Signed)
Addended by: Lurlean HornsWILSON, Pachia Strum H on: 06/25/2015 04:26 PM   Modules accepted: Orders

## 2015-06-26 LAB — URINALYSIS, COMPLETE
Bilirubin, UA: NEGATIVE
Glucose, UA: NEGATIVE
Ketones, UA: NEGATIVE
LEUKOCYTES UA: NEGATIVE
NITRITE UA: NEGATIVE
PH UA: 6 (ref 5.0–7.5)
PROTEIN UA: NEGATIVE
RBC, UA: NEGATIVE
SPEC GRAV UA: 1.013 (ref 1.005–1.030)
Urobilinogen, Ur: 0.2 mg/dL (ref 0.2–1.0)

## 2015-06-26 LAB — MICROSCOPIC EXAMINATION: CASTS: NONE SEEN /LPF

## 2015-07-26 ENCOUNTER — Inpatient Hospital Stay
Admission: AD | Admit: 2015-07-26 | Discharge: 2015-07-26 | Disposition: A | Discharge status: Home / Self Care | Payer: PPO | Source: Ambulatory Visit | Attending: Internal Medicine | Admitting: Internal Medicine

## 2015-07-26 ENCOUNTER — Inpatient Hospital Stay: Payer: PPO

## 2015-07-26 ENCOUNTER — Encounter: Payer: Self-pay | Admitting: Family Medicine

## 2015-07-26 ENCOUNTER — Inpatient Hospital Stay
Admission: AD | Admit: 2015-07-26 | Discharge: 2015-07-30 | DRG: 291 | Disposition: A | Discharge status: Home / Self Care | Payer: PPO | Source: Ambulatory Visit | Attending: Internal Medicine | Admitting: Internal Medicine

## 2015-07-26 DIAGNOSIS — Z792 Long term (current) use of antibiotics: Secondary | ICD-10-CM | POA: Diagnosis not present

## 2015-07-26 DIAGNOSIS — Z87891 Personal history of nicotine dependence: Secondary | ICD-10-CM

## 2015-07-26 DIAGNOSIS — E871 Hypo-osmolality and hyponatremia: Secondary | ICD-10-CM | POA: Diagnosis present

## 2015-07-26 DIAGNOSIS — Z833 Family history of diabetes mellitus: Secondary | ICD-10-CM

## 2015-07-26 DIAGNOSIS — Z9852 Vasectomy status: Secondary | ICD-10-CM | POA: Diagnosis not present

## 2015-07-26 DIAGNOSIS — Z79899 Other long term (current) drug therapy: Secondary | ICD-10-CM | POA: Diagnosis not present

## 2015-07-26 DIAGNOSIS — I69351 Hemiplegia and hemiparesis following cerebral infarction affecting right dominant side: Secondary | ICD-10-CM

## 2015-07-26 DIAGNOSIS — E785 Hyperlipidemia, unspecified: Secondary | ICD-10-CM | POA: Diagnosis present

## 2015-07-26 DIAGNOSIS — I130 Hypertensive heart and chronic kidney disease with heart failure and stage 1 through stage 4 chronic kidney disease, or unspecified chronic kidney disease: Principal | ICD-10-CM | POA: Diagnosis present

## 2015-07-26 DIAGNOSIS — Z951 Presence of aortocoronary bypass graft: Secondary | ICD-10-CM | POA: Diagnosis not present

## 2015-07-26 DIAGNOSIS — I5023 Acute on chronic systolic (congestive) heart failure: Secondary | ICD-10-CM | POA: Diagnosis present

## 2015-07-26 DIAGNOSIS — Z95810 Presence of automatic (implantable) cardiac defibrillator: Secondary | ICD-10-CM

## 2015-07-26 DIAGNOSIS — N184 Chronic kidney disease, stage 4 (severe): Secondary | ICD-10-CM | POA: Diagnosis present

## 2015-07-26 DIAGNOSIS — Z9049 Acquired absence of other specified parts of digestive tract: Secondary | ICD-10-CM

## 2015-07-26 DIAGNOSIS — Z8249 Family history of ischemic heart disease and other diseases of the circulatory system: Secondary | ICD-10-CM | POA: Diagnosis not present

## 2015-07-26 DIAGNOSIS — Z794 Long term (current) use of insulin: Secondary | ICD-10-CM

## 2015-07-26 DIAGNOSIS — Z7982 Long term (current) use of aspirin: Secondary | ICD-10-CM | POA: Diagnosis not present

## 2015-07-26 DIAGNOSIS — K219 Gastro-esophageal reflux disease without esophagitis: Secondary | ICD-10-CM | POA: Diagnosis present

## 2015-07-26 DIAGNOSIS — Z888 Allergy status to other drugs, medicaments and biological substances status: Secondary | ICD-10-CM

## 2015-07-26 DIAGNOSIS — E1122 Type 2 diabetes mellitus with diabetic chronic kidney disease: Secondary | ICD-10-CM | POA: Diagnosis present

## 2015-07-26 DIAGNOSIS — I255 Ischemic cardiomyopathy: Secondary | ICD-10-CM | POA: Diagnosis present

## 2015-07-26 DIAGNOSIS — N179 Acute kidney failure, unspecified: Secondary | ICD-10-CM | POA: Diagnosis present

## 2015-07-26 DIAGNOSIS — I4892 Unspecified atrial flutter: Secondary | ICD-10-CM | POA: Diagnosis present

## 2015-07-26 DIAGNOSIS — I2510 Atherosclerotic heart disease of native coronary artery without angina pectoris: Secondary | ICD-10-CM | POA: Diagnosis present

## 2015-07-26 DIAGNOSIS — I959 Hypotension, unspecified: Secondary | ICD-10-CM | POA: Diagnosis present

## 2015-07-26 DIAGNOSIS — E11319 Type 2 diabetes mellitus with unspecified diabetic retinopathy without macular edema: Secondary | ICD-10-CM | POA: Diagnosis present

## 2015-07-26 DIAGNOSIS — N2581 Secondary hyperparathyroidism of renal origin: Secondary | ICD-10-CM | POA: Diagnosis present

## 2015-07-26 DIAGNOSIS — J449 Chronic obstructive pulmonary disease, unspecified: Secondary | ICD-10-CM | POA: Diagnosis present

## 2015-07-26 DIAGNOSIS — I420 Dilated cardiomyopathy: Secondary | ICD-10-CM | POA: Diagnosis present

## 2015-07-26 DIAGNOSIS — I509 Heart failure, unspecified: Secondary | ICD-10-CM

## 2015-07-26 HISTORY — DX: Type 2 diabetes mellitus with unspecified diabetic retinopathy without macular edema: E11.319

## 2015-07-26 HISTORY — DX: Benign prostatic hyperplasia without lower urinary tract symptoms: N40.0

## 2015-07-26 LAB — CBC
HCT: 35.2 % — ABNORMAL LOW (ref 40.0–52.0)
HEMOGLOBIN: 11.9 g/dL — AB (ref 13.0–18.0)
MCH: 31.8 pg (ref 26.0–34.0)
MCHC: 33.9 g/dL (ref 32.0–36.0)
MCV: 93.9 fL (ref 80.0–100.0)
PLATELETS: 122 10*3/uL — AB (ref 150–440)
RBC: 3.75 MIL/uL — AB (ref 4.40–5.90)
RDW: 15.4 % — ABNORMAL HIGH (ref 11.5–14.5)
WBC: 5.7 10*3/uL (ref 3.8–10.6)

## 2015-07-26 LAB — BASIC METABOLIC PANEL
Anion gap: 12 (ref 5–15)
BUN: 101 mg/dL — AB (ref 6–20)
CO2: 26 mmol/L (ref 22–32)
CREATININE: 2.91 mg/dL — AB (ref 0.61–1.24)
Calcium: 8.6 mg/dL — ABNORMAL LOW (ref 8.9–10.3)
Chloride: 85 mmol/L — ABNORMAL LOW (ref 101–111)
GFR, EST AFRICAN AMERICAN: 22 mL/min — AB (ref >60)
GFR, EST NON AFRICAN AMERICAN: 19 mL/min — AB (ref >60)
Glucose, Bld: 122 mg/dL — ABNORMAL HIGH (ref 65–99)
Potassium: 4.5 mmol/L (ref 3.5–5.1)
SODIUM: 123 mmol/L — AB (ref 135–145)

## 2015-07-26 LAB — GLUCOSE, CAPILLARY
Glucose-Capillary: 137 mg/dL — ABNORMAL HIGH (ref 65–99)
Glucose-Capillary: 180 mg/dL — ABNORMAL HIGH (ref 65–99)

## 2015-07-26 MED ORDER — PRAVASTATIN SODIUM 20 MG PO TABS
80.0000 mg | ORAL_TABLET | Freq: Every day | ORAL | Status: DC
  Administered 2015-07-26 – 2015-07-29 (×4): 80 mg via ORAL
  Filled 2015-07-26 (×4): qty 4

## 2015-07-26 MED ORDER — OMEGA-3-ACID ETHYL ESTERS 1 G PO CAPS
2.0000 | ORAL_CAPSULE | Freq: Two times a day (BID) | ORAL | Status: DC
  Administered 2015-07-26 – 2015-07-30 (×8): 2 g via ORAL
  Filled 2015-07-26 (×9): qty 2

## 2015-07-26 MED ORDER — VITAMIN B-12 100 MCG PO TABS
100.0000 ug | ORAL_TABLET | Freq: Every day | ORAL | Status: DC
  Administered 2015-07-27 – 2015-07-30 (×4): 100 ug via ORAL
  Filled 2015-07-26 (×4): qty 1

## 2015-07-26 MED ORDER — INSULIN ASPART 100 UNIT/ML ~~LOC~~ SOLN
15.0000 [IU] | Freq: Three times a day (TID) | SUBCUTANEOUS | Status: DC
  Administered 2015-07-26 – 2015-07-28 (×6): 15 [IU] via SUBCUTANEOUS
  Filled 2015-07-26 (×6): qty 15

## 2015-07-26 MED ORDER — ONDANSETRON HCL 4 MG PO TABS: 4.0000 mg | ORAL_TABLET | Freq: Four times a day (QID) | ORAL | Status: DC | PRN

## 2015-07-26 MED ORDER — PANTOPRAZOLE SODIUM 40 MG PO TBEC
40.0000 mg | DELAYED_RELEASE_TABLET | Freq: Every day | ORAL | Status: DC
  Administered 2015-07-27 – 2015-07-30 (×4): 40 mg via ORAL
  Filled 2015-07-26 (×4): qty 1

## 2015-07-26 MED ORDER — INSULIN ASPART 100 UNIT/ML ~~LOC~~ SOLN
0.0000 [IU] | Freq: Three times a day (TID) | SUBCUTANEOUS | Status: DC
  Administered 2015-07-26 – 2015-07-27 (×2): 1 [IU] via SUBCUTANEOUS
  Administered 2015-07-27 – 2015-07-29 (×2): 2 [IU] via SUBCUTANEOUS
  Administered 2015-07-29: 3 [IU] via SUBCUTANEOUS
  Filled 2015-07-26: qty 3
  Filled 2015-07-26 (×2): qty 2
  Filled 2015-07-26: qty 1

## 2015-07-26 MED ORDER — TIOTROPIUM BROMIDE MONOHYDRATE 18 MCG IN CAPS
18.0000 ug | ORAL_CAPSULE | Freq: Every day | RESPIRATORY_TRACT | Status: DC
  Administered 2015-07-27 – 2015-07-30 (×4): 18 ug via RESPIRATORY_TRACT
  Filled 2015-07-26: qty 5

## 2015-07-26 MED ORDER — FERROUS SULFATE 325 (65 FE) MG PO TABS
325.0000 mg | ORAL_TABLET | Freq: Every day | ORAL | Status: DC
  Administered 2015-07-27 – 2015-07-30 (×4): 325 mg via ORAL
  Filled 2015-07-26 (×5): qty 1

## 2015-07-26 MED ORDER — POLYETHYLENE GLYCOL 3350 17 G PO PACK: 17.0000 g | PACK | Freq: Every day | ORAL | Status: DC | PRN

## 2015-07-26 MED ORDER — MAGNESIUM OXIDE 400 (241.3 MG) MG PO TABS
400.0000 mg | ORAL_TABLET | Freq: Every day | ORAL | Status: DC
  Administered 2015-07-27 – 2015-07-30 (×4): 400 mg via ORAL
  Filled 2015-07-26 (×4): qty 1

## 2015-07-26 MED ORDER — ACETAMINOPHEN 650 MG RE SUPP: 650.0000 mg | Freq: Four times a day (QID) | RECTAL | Status: DC | PRN

## 2015-07-26 MED ORDER — ONDANSETRON HCL 4 MG/2ML IJ SOLN: 4.0000 mg | Freq: Four times a day (QID) | INTRAMUSCULAR | Status: DC | PRN

## 2015-07-26 MED ORDER — APIXABAN 2.5 MG PO TABS
2.5000 mg | ORAL_TABLET | Freq: Every day | ORAL | Status: DC
  Administered 2015-07-27 – 2015-07-29 (×3): 2.5 mg via ORAL
  Filled 2015-07-26 (×3): qty 1

## 2015-07-26 MED ORDER — VITAMIN B-6 50 MG PO TABS
100.0000 mg | ORAL_TABLET | Freq: Every day | ORAL | Status: DC
  Administered 2015-07-27 – 2015-07-30 (×4): 100 mg via ORAL
  Filled 2015-07-26: qty 1
  Filled 2015-07-26 (×4): qty 2

## 2015-07-26 MED ORDER — SODIUM CHLORIDE 0.9% FLUSH
3.0000 mL | Freq: Two times a day (BID) | INTRAVENOUS | Status: DC
  Administered 2015-07-26 – 2015-07-27 (×2): 3 mL via INTRAVENOUS

## 2015-07-26 MED ORDER — SODIUM BICARBONATE 650 MG PO TABS
1300.0000 mg | ORAL_TABLET | Freq: Three times a day (TID) | ORAL | Status: DC
  Administered 2015-07-26 – 2015-07-28 (×6): 1300 mg via ORAL
  Filled 2015-07-26 (×7): qty 2

## 2015-07-26 MED ORDER — FINASTERIDE 5 MG PO TABS
5.0000 mg | ORAL_TABLET | Freq: Every day | ORAL | Status: DC
Start: 2015-07-26 — End: 2015-07-30
  Administered 2015-07-26 – 2015-07-29 (×4): 5 mg via ORAL
  Filled 2015-07-26 (×4): qty 1

## 2015-07-26 MED ORDER — VITAMIN D 1000 UNITS PO TABS
1000.0000 [IU] | ORAL_TABLET | Freq: Every day | ORAL | Status: DC
  Administered 2015-07-27 – 2015-07-30 (×4): 1000 [IU] via ORAL
  Filled 2015-07-26 (×5): qty 1

## 2015-07-26 MED ORDER — COLESEVELAM HCL 625 MG PO TABS
1875.0000 mg | ORAL_TABLET | Freq: Two times a day (BID) | ORAL | Status: DC
  Administered 2015-07-26 – 2015-07-30 (×8): 1875 mg via ORAL
  Filled 2015-07-26 (×10): qty 3

## 2015-07-26 MED ORDER — TAMSULOSIN HCL 0.4 MG PO CAPS
0.4000 mg | ORAL_CAPSULE | Freq: Two times a day (BID) | ORAL | Status: DC
  Administered 2015-07-26 – 2015-07-30 (×8): 0.4 mg via ORAL
  Filled 2015-07-26 (×8): qty 1

## 2015-07-26 MED ORDER — DOCUSATE SODIUM 100 MG PO CAPS
100.0000 mg | ORAL_CAPSULE | Freq: Two times a day (BID) | ORAL | Status: DC
  Administered 2015-07-26 – 2015-07-30 (×8): 100 mg via ORAL
  Filled 2015-07-26 (×9): qty 1

## 2015-07-26 MED ORDER — INSULIN ASPART 100 UNIT/ML ~~LOC~~ SOLN
0.0000 [IU] | Freq: Every day | SUBCUTANEOUS | Status: DC
  Administered 2015-07-29: 2 [IU] via SUBCUTANEOUS
  Filled 2015-07-26: qty 2
  Filled 2015-07-26: qty 1

## 2015-07-26 MED ORDER — ACETAMINOPHEN 325 MG PO TABS: 650.0000 mg | ORAL_TABLET | Freq: Four times a day (QID) | ORAL | Status: DC | PRN

## 2015-07-26 MED ORDER — ASPIRIN EC 81 MG PO TBEC
81.0000 mg | DELAYED_RELEASE_TABLET | Freq: Every day | ORAL | Status: DC
  Administered 2015-07-27 – 2015-07-30 (×4): 81 mg via ORAL
  Filled 2015-07-26 (×4): qty 1

## 2015-07-26 MED ORDER — FUROSEMIDE 10 MG/ML IJ SOLN
5.0000 mg/h | INTRAMUSCULAR | Status: AC
  Administered 2015-07-26: 5 mg/h via INTRAVENOUS
  Filled 2015-07-26: qty 25

## 2015-07-26 MED ORDER — EZETIMIBE 10 MG PO TABS
10.0000 mg | ORAL_TABLET | Freq: Every day | ORAL | Status: DC
  Administered 2015-07-27 – 2015-07-30 (×4): 10 mg via ORAL
  Filled 2015-07-26 (×5): qty 1

## 2015-07-26 NOTE — Progress Notes (Signed)
*  PRELIMINARY RESULTS* Echocardiogram 2D Echocardiogram has been performed.  Garrel Ridgelikeshia S Stills 07/26/2015, 6:57 PM

## 2015-07-26 NOTE — Progress Notes (Addendum)
Patient direct admit from Dr. Garnett FarmLateef's office. No complaints at this time. Full assessment as charted. Skin assessment with Teodoro Kiloll RN. Tele box verified by Leslie Dalesoll RN and Candace NT with [facility]. Wife taking belongings home.  Patient educated on safety. Will continue to monitor. Dr. Nemiah CommanderKalisetti at bedside now, aware of low BP - goal to keep SBP 80 mmHg or above during lasix drip.

## 2015-07-26 NOTE — H&P (Signed)
[facility] - [place] at [facility]   PATIENT NAME: Matthew [name]    MR#:  [id number]  DATE OF BIRTH:  March 15, 1936  DATE OF ADMISSION:  07/26/2015  PRIMARY CARE PHYSICIAN: Vonita Moss, MD   REQUESTING/REFERRING PHYSICIAN: Dr. Mady Haagensen  CHIEF COMPLAINT:  No chief complaint on file. Worsening Pedal Edema  HISTORY OF PRESENT ILLNESS:  Matthew [name]  is a 80 y.o. male with a known history of CAD s/p CABG, CVA with residual right sided weakness, COPD not on home o2, Atrial flutter s/p ablations and cardioversion on eliquis now, CKD stage 3, CHF with systolic dysfunction EF 15% sent in from nephrology office for worsening pedal edema and weight gain in the last 2-3 weeks. Patient's normal dry weight is around 182 pounds, his weight is greater than 207 pounds today during office visit with his nephrologist. He has noticed that his weight has been steadily increasing over the last 2-3 weeks. Seen by cardiologist during an office visit about 10 days ago and was asked to take his metolazone every day along with his torsemide. Lasix drip was suggested at the time but patient wanted to try outpatient diuretics. He does complain of worsening shortness of breath, increasing dyspnea even with short distances and orthopnea. His urine output has been steadily decreasing. His labs today in the office also indicated that his renal function was worsening. GFR decreased from 31 at baseline to 21. So he is being sent to the hospital for Lasix drip and close monitoring of his renal function. Patient denies any chest pain, nausea vomiting or abdominal pain.  PAST MEDICAL HISTORY:   Past Medical History  Diagnosis Date  . CAD (coronary artery disease)     s/p CABG  . Cerebral vascular accident [facility])     with residual right sided weakness  . Restrictive lung disease     Not on home o2  . A-fib [facility])     s/p ablation  . COPD (chronic obstructive pulmonary disease) (HCC)   .  Chronic kidney disease     stage 3, GFR 31 at baseline  . Diabetes mellitus without complication (HCC)   . Type 2 diabetes mellitus with stage 3 chronic kidney disease (HCC)   . Cardiomyopathy (HCC)     Last EF 15% in Nov 2016  . Hyperlipidemia   . Hypertension   . Diabetic retinopathy (HCC)   . BPH (benign prostatic hyperplasia)     PAST SURGICAL HISTORY:   Past Surgical History  Procedure Laterality Date  . Coronary artery bypass graft    . Pacemaker insertion    . Vasectomy    . Appendectomy    . Cardiac defibrillator placement      SOCIAL HISTORY:   Social History  Substance Use Topics  . Smoking status: Former Smoker    Types: Cigarettes, Cigars, Pipe    Quit date: 09/27/1968  . Smokeless tobacco: Former Neurosurgeon    Types: Chew    Quit date: 10/01/1979  . Alcohol Use: No    FAMILY HISTORY:   Family History  Problem Relation Age of Onset  . Diabetes Mother   . Other Father     Ruptured AAA  . CAD Mother     DRUG ALLERGIES:   Allergies  Allergen Reactions  . Heparin Other (See Comments)    Reaction:  Unknown   . Lipitor [Atorvastatin] Other (See Comments)    Reaction:  Memory issues   . Toujeo Solostar [Insulin Glargine] Other (  See Comments)    Reaction:  Weight gain     REVIEW OF SYSTEMS:   Review of Systems  Constitutional: Positive for malaise/fatigue. Negative for fever, chills and weight loss.  HENT: Negative for ear discharge, ear pain, hearing loss, nosebleeds and tinnitus.   Eyes: Positive for redness. Negative for blurred vision, double vision and photophobia.       Left eye conjunctival erythema  Respiratory: Positive for shortness of breath. Negative for cough, hemoptysis and wheezing.   Cardiovascular: Positive for leg swelling. Negative for chest pain, palpitations and orthopnea.  Gastrointestinal: Negative for heartburn, nausea, vomiting, abdominal pain, diarrhea, constipation and melena.  Genitourinary: Negative for dysuria, urgency  and hematuria.       Decreased urinary frequency  Musculoskeletal: Negative for myalgias, back pain and neck pain.  Skin: Negative for rash.  Neurological: Negative for dizziness, tremors, sensory change, speech change, focal weakness and headaches.  Endo/Heme/Allergies: Bruises/bleeds easily.  Psychiatric/Behavioral: Negative for depression.    MEDICATIONS AT HOME:   Prior to Admission medications   Medication Sig Start Date End Date Taking? Authorizing Provider  apixaban (ELIQUIS) 2.5 MG TABS tablet Take 2.5 mg by mouth 2 (two) times daily.    Yes Historical Provider, MD  aspirin EC 81 MG tablet Take 81 mg by mouth daily.    Yes Historical Provider, MD  B Complex-C (B-COMPLEX WITH VITAMIN C) tablet Take 1 tablet by mouth daily.   Yes Historical Provider, MD  carvedilol (COREG) 3.125 MG tablet Take 3.125 mg by mouth 2 (two) times daily with a meal.   Yes Historical Provider, MD  cholecalciferol (VITAMIN D) 1000 units tablet Take 1,000 Units by mouth daily.   Yes Historical Provider, MD  colesevelam (WELCHOL) 625 MG tablet Take 3 tablets (1,875 mg total) by mouth 2 (two) times daily. 11/20/14  Yes Steele Sizer, MD  ezetimibe (ZETIA) 10 MG tablet Take 1 tablet (10 mg total) by mouth daily. 11/20/14  Yes Steele Sizer, MD  ferrous sulfate 325 (65 FE) MG tablet Take 325 mg by mouth daily with breakfast.    Yes Historical Provider, MD  finasteride (PROSCAR) 5 MG tablet Take 1 tablet (5 mg total) by mouth at bedtime. 11/20/14  Yes Steele Sizer, MD  insulin lispro (HUMALOG KWIKPEN) 100 UNIT/ML KiwkPen Inject 8 Units into the skin 2 (two) times daily.   Yes Historical Provider, MD  Magnesium 250 MG TABS Take 250 mg by mouth daily.    Yes Historical Provider, MD  metolazone (ZAROXOLYN) 5 MG tablet Take 5 mg by mouth daily.   Yes Historical Provider, MD  omega-3 fish oil (MAXEPA) 1000 MG CAPS capsule Take 2 capsules by mouth 2 (two) times daily.   Yes Historical Provider, MD  pantoprazole  (PROTONIX) 40 MG tablet Take 40 mg by mouth daily.   Yes Historical Provider, MD  pravastatin (PRAVACHOL) 80 MG tablet Take 1 tablet (80 mg total) by mouth at bedtime. 11/20/14  Yes Steele Sizer, MD  pyridOXINE (VITAMIN B-6) 50 MG tablet Take 50 mg by mouth daily.   Yes Historical Provider, MD  sodium bicarbonate 650 MG tablet Take 1,300 mg by mouth 2 (two) times daily.    Yes Historical Provider, MD  spironolactone (ALDACTONE) 25 MG tablet Take 12.5 mg by mouth daily.    Yes Historical Provider, MD  tamsulosin (FLOMAX) 0.4 MG CAPS capsule Take 1 capsule (0.4 mg total) by mouth 2 (two) times daily. 11/20/14  Yes Steele Sizer, MD  telmisartan (MICARDIS) 40 MG tablet Take 40 mg by mouth daily.   Yes Historical Provider, MD  tiotropium (SPIRIVA) 18 MCG inhalation capsule Place 18 mcg into inhaler and inhale daily.   Yes Historical Provider, MD  torsemide (DEMADEX) 20 MG tablet Take 20 mg by mouth daily.    Yes Historical Provider, MD  vitamin B-12 (CYANOCOBALAMIN) 100 MCG tablet Take 100 mcg by mouth daily.    Yes Historical Provider, MD  vitamin E 400 UNIT capsule Take 400 Units by mouth daily.    Yes Historical Provider, MD      VITAL SIGNS:  Blood pressure 87/56, pulse 60, resp. rate 20, SpO2 97 %.  PHYSICAL EXAMINATION:   Physical Exam  GENERAL:  80 y.o.-year-old patient lying in the bed with no acute distress.  EYES: Pupils equal, round, reactive to light and accommodation. No scleral icterus but left eye conjunctival erythema. Extraocular muscles intact.  HEENT: Head atraumatic, normocephalic. Oropharynx and nasopharynx clear.  NECK:  Supple, no jugular venous distention. No thyroid enlargement, no tenderness.  LUNGS: Normal breath sounds bilaterally, no wheezing, rales,rhonchi or crepitation. No use of accessory muscles of respiration. Decreased bibasilar breath sounds CARDIOVASCULAR: S1, S2 normal. No rubs, or gallops. 2/6 systolic murmur present. ABDOMEN: Soft, nontender,  nondistended. Bowel sounds present. No organomegaly or mass.  EXTREMITIES: No cyanosis, or clubbing. 3+ edema until the knees with fluid blisters NEUROLOGIC: Cranial nerves II through XII are intact. Muscle strength 5/5 in all extremities. Sensation intact. Gait not checked.  PSYCHIATRIC: The patient is alert and oriented x 3.  SKIN: No obvious rash, lesion, or ulcer. Multiple bruises on abdomen and thighs from insulin injections  LABORATORY PANEL:   CBC No results for input(s): WBC, HGB, HCT, PLT in the last 168 hours. ------------------------------------------------------------------------------------------------------------------  Chemistries  No results for input(s): NA, K, CL, CO2, GLUCOSE, BUN, CREATININE, CALCIUM, MG, AST, ALT, ALKPHOS, BILITOT in the last 168 hours.  Invalid input(s): GFRCGP ------------------------------------------------------------------------------------------------------------------  Cardiac Enzymes No results for input(s): TROPONINI in the last 168 hours. ------------------------------------------------------------------------------------------------------------------  RADIOLOGY:  No results found.  EKG:   Orders placed or performed during the hospital encounter of 07/26/15  . EKG 12-Lead  . EKG 12-Lead    IMPRESSION AND PLAN:   Matthew [name]  is a 80 y.o. male with a known history of CAD s/p CABG, CVA with residual right sided weakness, COPD not on home o2, Atrial flutter s/p ablations and cardioversion on eliquis now, CKD stage 3, CHF with systolic dysfunction EF 15% sent in from nephrology office for worsening pedal edema and weight gain in the last 2-3 weeks.  #1 acute on chronic systolic CHF exacerbation- known EF less than 15% -Ischemic cardiomyopathy. Check echocardiogram -Cardiology consulted. Will be started on Lasix drip at this time. -Daily weights, strict input and output monitoring -Chest x-ray ordered  #2 acute renal failure on  CK D stage III-baseline GFR is 31, has been steadily decreasing due to diurese sepsis and worsening edema as outpatient. -Nephrology consulted. Hold nephrotoxic medications. -Likely cardiorenal syndrome. -We'll be on Lasix drip, monitor closely. If further worsening noticed, might need to consider temporary renal replacement therapy  #4 CAD status post CABG-appears stable at this time. Status post bypass surgery. -Cardiac meds on hold due to hypotension. Monitor closely. -Follow up echocardiogram  #5 atrial flutter-status post cardioversion and ablation in the past. Monitor on telemetry. -Hold Coreg now due to low blood pressure. -Continue eliquis for anticoagulation  #6 GERD-on Protonix  #6 hyperlipidemia-continue home medications  #  7 DVT prophylaxis-already on eliquis. Monitor hemoglobin    All the records are reviewed and case discussed with ED provider. Management plans discussed with the patient, family and they are in agreement.  CODE STATUS: Full Code  TOTAL TIME TAKING CARE OF THIS PATIENT: 50 minutes.    Enid Baas M.D on 07/26/2015 at 2:19 PM  Between 7am to 6pm - Pager - [phone]  After 6pm go to [email or web address] - password [email or web address] [facility]  [facility]  Office  [phone]  CC: Primary care physician; Vonita Moss, MD

## 2015-07-27 LAB — CBC
HEMATOCRIT: 35.1 % — AB (ref 40.0–52.0)
HEMOGLOBIN: 12.1 g/dL — AB (ref 13.0–18.0)
MCH: 32.2 pg (ref 26.0–34.0)
MCHC: 34.5 g/dL (ref 32.0–36.0)
MCV: 93.3 fL (ref 80.0–100.0)
Platelets: 129 10*3/uL — ABNORMAL LOW (ref 150–440)
RBC: 3.76 MIL/uL — AB (ref 4.40–5.90)
RDW: 15.4 % — ABNORMAL HIGH (ref 11.5–14.5)
WBC: 6 10*3/uL (ref 3.8–10.6)

## 2015-07-27 LAB — GLUCOSE, CAPILLARY
GLUCOSE-CAPILLARY: 141 mg/dL — AB (ref 65–99)
GLUCOSE-CAPILLARY: 184 mg/dL — AB (ref 65–99)
GLUCOSE-CAPILLARY: 102 mg/dL — AB (ref 65–99)
Glucose-Capillary: 118 mg/dL — ABNORMAL HIGH (ref 65–99)

## 2015-07-27 LAB — BASIC METABOLIC PANEL
ANION GAP: 12 (ref 5–15)
BUN: 102 mg/dL — ABNORMAL HIGH (ref 6–20)
CALCIUM: 9 mg/dL (ref 8.9–10.3)
CO2: 29 mmol/L (ref 22–32)
Chloride: 86 mmol/L — ABNORMAL LOW (ref 101–111)
Creatinine, Ser: 2.62 mg/dL — ABNORMAL HIGH (ref 0.61–1.24)
GFR, EST AFRICAN AMERICAN: 25 mL/min — AB (ref >60)
GFR, EST NON AFRICAN AMERICAN: 22 mL/min — AB (ref >60)
Glucose, Bld: 97 mg/dL (ref 65–99)
POTASSIUM: 4.4 mmol/L (ref 3.5–5.1)
Sodium: 127 mmol/L — ABNORMAL LOW (ref 135–145)

## 2015-07-27 LAB — ECHOCARDIOGRAM COMPLETE
HEIGHTINCHES: 71 in
WEIGHTICAEL: 3196.8 [oz_av]

## 2015-07-27 MED ORDER — FUROSEMIDE 10 MG/ML IJ SOLN
5.0000 mg/h | INTRAVENOUS | Status: AC
  Administered 2015-07-27: 5 mg/h via INTRAVENOUS
  Filled 2015-07-27: qty 25

## 2015-07-27 NOTE — Consult Note (Signed)
[facility] CLINIC CARDIOLOGY [facility]  CARDIOLOGY CONSULT NOTE  Patient ID: Matthew [name] MRN: [id number] DOB/AGE: June 17, 1935 80 y.o.  Admit date: 07/26/2015 Referring Physician Dr. Clint Guy Primary Physician   Primary Cardiologist Dr. Lady Gary Reason for Consultation CHF  HPI: Patient is a 80 year old male with history of chronic systolic heart failure with a ejection fraction of 15-20% chronically. He has an AICD biventricular pacemaker in place. He also has chronic kidney disease. Over the past several weeks has noted progressive weight gain peripheral edema and shortness of breath. Attempts at outpatient diuresis following renal function have been unsuccessful. Patient is now admitted for a furosemide drip. He denies orthopnea or PND but complains of worsening peripheral edema and shortness of breath. Serum creatinine is 2.91 from 2.39. GFR is 19 down from 25 mL/m. He is hyponatremic with a serum potassium of 123 and a serum potassium of 0.5. Etiology of his worsening renal failure likely secondary to poor cardiac output as well as increasing attempts at diuresis. He has a history of atrial fibrillation and is status post ablation. Patient denies chest pain. We have been quite compliant with medications and attempt a low sodium diet. Cardiogram reveals AV paced rhythm. He denies syncope or presyncope. He attempts to eat a low-sodium diet.  Review of Systems  Constitutional: Positive for malaise/fatigue.  HENT: Negative.   Eyes: Negative.   Respiratory: Positive for shortness of breath.   Cardiovascular: Positive for leg swelling.  Gastrointestinal: Negative.   Genitourinary: Negative.   Musculoskeletal: Negative.   Skin: Negative.   Neurological: Negative.   Endo/Heme/Allergies: Negative.   Psychiatric/Behavioral: Negative.     Past Medical History  Diagnosis Date  . CAD (coronary artery disease)     s/p CABG  . Cerebral vascular accident [facility])     with residual right  sided weakness  . Restrictive lung disease     Not on home o2  . A-fib [facility])     s/p ablation  . COPD (chronic obstructive pulmonary disease) (HCC)   . Chronic kidney disease     stage 3, GFR 31 at baseline  . Diabetes mellitus without complication (HCC)   . Type 2 diabetes mellitus with stage 3 chronic kidney disease (HCC)   . Cardiomyopathy (HCC)     Last EF 15% in Nov 2016  . Hyperlipidemia   . Hypertension   . Diabetic retinopathy (HCC)   . BPH (benign prostatic hyperplasia)     Family History  Problem Relation Age of Onset  . Diabetes Mother   . Other Father     Ruptured AAA  . CAD Mother     Social History   Social History  . Marital Status: Married    Spouse Name: N/A  . Number of Children: N/A  . Years of Education: N/A   Occupational History  . Not on file.   Social History Main Topics  . Smoking status: Former Smoker    Types: Cigarettes, Cigars, Pipe    Quit date: 09/27/1968  . Smokeless tobacco: Former Neurosurgeon    Types: Chew    Quit date: 10/01/1979  . Alcohol Use: No  . Drug Use: No  . Sexual Activity: Not on file   Other Topics Concern  . Not on file   Social History Narrative   Lives at home with wife    Past Surgical History  Procedure Laterality Date  . Coronary artery bypass graft    . Pacemaker insertion    . Vasectomy    .  Appendectomy    . Cardiac defibrillator placement       Prescriptions prior to admission  Medication Sig Dispense Refill Last Dose  . apixaban (ELIQUIS) 2.5 MG TABS tablet Take 2.5 mg by mouth 2 (two) times daily.    unknown at unknown   . aspirin EC 81 MG tablet Take 81 mg by mouth daily.    unknown at unknown   . B Complex-C (B-COMPLEX WITH VITAMIN C) tablet Take 1 tablet by mouth daily.   unknown at unknown   . carvedilol (COREG) 3.125 MG tablet Take 3.125 mg by mouth 2 (two) times daily with a meal.   unknown at unknown   . cholecalciferol (VITAMIN D) 1000 units tablet Take 1,000 Units by mouth daily.   unknown  at unknown   . colesevelam (WELCHOL) 625 MG tablet Take 3 tablets (1,875 mg total) by mouth 2 (two) times daily. 180 tablet 12 unknown at unknown   . ezetimibe (ZETIA) 10 MG tablet Take 1 tablet (10 mg total) by mouth daily. 30 tablet 12 unknown at unknown   . ferrous sulfate 325 (65 FE) MG tablet Take 325 mg by mouth daily with breakfast.    unknown at unknown   . finasteride (PROSCAR) 5 MG tablet Take 1 tablet (5 mg total) by mouth at bedtime. 30 tablet 12 unknown at unknown   . insulin lispro (HUMALOG KWIKPEN) 100 UNIT/ML KiwkPen Inject 8 Units into the skin 2 (two) times daily.   unknown at unknown  . Magnesium 250 MG TABS Take 250 mg by mouth daily.    unknown at unknown   . metolazone (ZAROXOLYN) 5 MG tablet Take 5 mg by mouth daily.   unknown at unknown   . omega-3 fish oil (MAXEPA) 1000 MG CAPS capsule Take 2 capsules by mouth 2 (two) times daily.   unknown at unknown   . pantoprazole (PROTONIX) 40 MG tablet Take 40 mg by mouth daily.   unknown at unknown   . pravastatin (PRAVACHOL) 80 MG tablet Take 1 tablet (80 mg total) by mouth at bedtime. 30 tablet 12 unknown at unknown  . pyridOXINE (VITAMIN B-6) 50 MG tablet Take 50 mg by mouth daily.   unknown at unknown   . sodium bicarbonate 650 MG tablet Take 1,300 mg by mouth 2 (two) times daily.    unknown at unknown   . spironolactone (ALDACTONE) 25 MG tablet Take 12.5 mg by mouth daily.    unknown at unknown   . tamsulosin (FLOMAX) 0.4 MG CAPS capsule Take 1 capsule (0.4 mg total) by mouth 2 (two) times daily. 60 capsule 12 unknown at unknown   . telmisartan (MICARDIS) 40 MG tablet Take 40 mg by mouth daily.   unknown at unknown   . tiotropium (SPIRIVA) 18 MCG inhalation capsule Place 18 mcg into inhaler and inhale daily.   unknown at unknown   . torsemide (DEMADEX) 20 MG tablet Take 20 mg by mouth daily.    unknown at unknown   . vitamin B-12 (CYANOCOBALAMIN) 100 MCG tablet Take 100 mcg by mouth daily.    unknown at unknown   . vitamin E  400 UNIT capsule Take 400 Units by mouth daily.    unknown at unknown     Physical Exam: Blood pressure 106/68, pulse 61, temperature 97.4 F (36.3 C), temperature source Oral, resp. rate 18, height 5\' 11"  (1.803 m), weight 89.177 kg (196 lb 9.6 oz), SpO2 95 %.   Wt Readings from Last 1 Encounters:  07/27/15 89.177 kg (196 lb 9.6 oz)     General appearance: alert and cooperative Head: Normocephalic, without obvious abnormality, atraumatic Resp: rhonchi bibasilar Cardio: regular rate and rhythm GI: soft, non-tender; bowel sounds normal; no masses,  no organomegaly Extremities: edema 2-3+ edema in his lower extremities Neurologic: Grossly normal  Labs:   Lab Results  Component Value Date   WBC 6.0 07/27/2015   HGB 12.1* 07/27/2015   HCT 35.1* 07/27/2015   MCV 93.3 07/27/2015   PLT 129* 07/27/2015    Recent Labs Lab 07/27/15 0557  NA 127*  K 4.4  CL 86*  CO2 29  BUN 102*  CREATININE 2.62*  CALCIUM 9.0  GLUCOSE 97   No results found for: CKTOTAL, CKMB, CKMBINDEX, TROPONINI    Radiology: Chest x-ray reveals chronic small bilateral pleural effusions left greater than right. This appears chronic and unchanged from previously. There is no overt pulmonary edema. EKG: Atrial ventricular paced rhythm  ASSESSMENT AND PLAN:  Patient is a 80 year old male with dilated cardiomyopathy with an ejection fraction of 15% chronically. Echo findings. There is no appreciable change in his LV function or valves. He is had worsening renal failure as well as worsening peripheral edema. He is hyponatremic and having poor urine output. Attempts at outpatient diuresis drip and further treatment options. Would continue with Lasix drip following electrolytes and urine output. He has -2 L since admission. We'll continue with furosemide drip and follow. Patient is relatively hypotensive which is chronic which precludes use of excellent reduction or low-dose beta blockers at present. Will readdress this  based on his course and hemodynamic improvement. Signed: Dalia Heading MD, [facility] 07/27/2015, 9:57 AM

## 2015-07-27 NOTE — Progress Notes (Signed)
Called prime to verify if lasix drip should be continued or not. Order for drip per Dr. Clint GuyHower was for 10 hours but per his note and nephrology there was no disconituation of the drip. Dr. Anne HahnWillis was called to clarify what to do about the drip. Dr. Anne HahnWillis ordered labs to determine kidney function. Will continue to monitor pt.

## 2015-07-27 NOTE — Progress Notes (Signed)
[facility]   PATIENT NAME: Matthew [name]    MRN#:  [id number]  DATE OF BIRTH:  [birth year]  SUBJECTIVE:  Hospital Day: 1 day Matthew [name] is a 80 y.o. male presenting with Edema.   Overnight events: No overnight events Interval Events: Diuresing well on Lasix drip no complaints  REVIEW OF SYSTEMS:  CONSTITUTIONAL: No fever, fatigue or weakness.  EYES: No blurred or double vision.  EARS, NOSE, AND THROAT: No tinnitus or ear pain.  RESPIRATORY: No cough, shortness of breath, wheezing or hemoptysis.  CARDIOVASCULAR: No chest pain, orthopnea,Positive edema.  GASTROINTESTINAL: No nausea, vomiting, diarrhea or abdominal pain.  GENITOURINARY: No dysuria, hematuria.  ENDOCRINE: No polyuria, nocturia,  HEMATOLOGY: No anemia, easy bruising or bleeding SKIN: No rash or lesion. MUSCULOSKELETAL: No joint pain or arthritis.   NEUROLOGIC: No tingling, numbness, weakness.  PSYCHIATRY: No anxiety or depression.   DRUG ALLERGIES:   Allergies  Allergen Reactions  . Heparin Other (See Comments)    Reaction:  Unknown   . Lipitor [Atorvastatin] Other (See Comments)    Reaction:  Memory issues   . Toujeo Solostar [Insulin Glargine] Other (See Comments)    Reaction:  Weight gain     VITALS:  Blood pressure 100/63, pulse 60, temperature 97.5 F (36.4 C), temperature source Oral, resp. rate 16, height 5\' 11"  (1.803 m), weight 89.177 kg (196 lb 9.6 oz), SpO2 99 %.  PHYSICAL EXAMINATION:  VITAL SIGNS: Filed Vitals:   07/27/15 0547 07/27/15 1122  BP: 106/68 100/63  Pulse: 61 60  Temp: 97.4 F (36.3 C) 97.5 F (36.4 C)  Resp: 18 16   GENERAL:80 y.o.male currently in no acute distress.  HEAD: Normocephalic, atraumatic.  EYES: Pupils equal, round, reactive to light. Extraocular muscles intact. No scleral icterus.  MOUTH: Moist mucosal membrane. Dentition intact. No abscess noted.  EAR, NOSE, THROAT: Clear without exudates. No external  lesions.  NECK: Supple. No thyromegaly. No nodules. No JVD.  PULMONARY: Clear to ascultation, without wheeze rails or rhonci. No use of accessory muscles, Good respiratory effort. good air entry bilaterally CHEST: Nontender to palpation.  CARDIOVASCULAR: S1 and S2. Regular rate and rhythm. No murmurs, rubs, or gallops. 2+ edema. Pedal pulses 2+ bilaterally.  GASTROINTESTINAL: Soft, nontender, nondistended. No masses. Positive bowel sounds. No hepatosplenomegaly.  MUSCULOSKELETAL: No swelling, clubbing, or edema. Range of motion full in all extremities.  NEUROLOGIC: Cranial nerves II through XII are intact. No gross focal neurological deficits. Sensation intact. Reflexes intact.  SKIN: No ulceration, lesions, rashes, or cyanosis. Skin warm and dry. Turgor intact.  PSYCHIATRIC: Mood, affect within normal limits. The patient is awake, alert and oriented x 3. Insight, judgment intact.      LABORATORY PANEL:   CBC  Recent Labs Lab 07/27/15 0557  WBC 6.0  HGB 12.1*  HCT 35.1*  PLT 129*   ------------------------------------------------------------------------------------------------------------------  Chemistries   Recent Labs Lab 07/27/15 0557  NA 127*  K 4.4  CL 86*  CO2 29  GLUCOSE 97  BUN 102*  CREATININE 2.62*  CALCIUM 9.0   ------------------------------------------------------------------------------------------------------------------  Cardiac Enzymes No results for input(s): TROPONINI in the last 168 hours. ------------------------------------------------------------------------------------------------------------------  RADIOLOGY:  Dg Chest 2 View  07/26/2015  CLINICAL DATA:  CHF, COPD.  Atrial fib. EXAM: CHEST  2 VIEW COMPARISON:  08/20/2014 FINDINGS: Left defibrillator remains in place, unchanged. Prior CABG. Cardiomegaly. Small bilateral pleural effusions are stable since prior study. Left basilar scarring or chronic atelectasis. Minimal right base  atelectasis.  No overt edema. IMPRESSION: Stable chronic small bilateral pleural effusions, left greater than right with chronic left base atelectasis or scarring and minimal right base atelectasis. Cardiomegaly. Electronically Signed   By: Charlett Nose M.D.   On: 07/26/2015 16:38    EKG:   Orders placed or performed during the hospital encounter of 07/26/15  . EKG 12-Lead  . EKG 12-Lead    ASSESSMENT AND PLAN:   Matthew [name] is a 80 y.o. male presenting with No chief complaint on file. . Admitted 07/26/2015 : Day #: 1 day 1 acute on chronic systolic CHF exacerbation- known EF less than 15% -Ischemic cardiomyopathy. Check echocardiogram  Lasix drip -Daily weights, strict input and output monitoring   2 acute renal failure on CK D stage III-baseline GFR is 31, has been steadily decreasing due to diurese sepsis and worsening edema as outpatient. -Nephrology consulted. Hold nephrotoxic medications. -Likely cardiorenal syndrome.   3 CAD status post CABG-appears stable at this time. Status post bypass surgery. -Cardiac meds on hold due to hypotension. Monitor closely.   4 atrial flutter paroxysmal-status post cardioversion and ablation in the past. Monitor on telemetry. -Hold Coreg now due to low blood pressure. -Continue eliquis for anticoagulation  5GERD without esophagitis-on Protonix  6 hyperlipidemia-continue home medications  7 DVT therapeutic eliquis All the records are reviewed and case discussed with Care Management/Social Workerr. Management plans discussed with the patient, family and they are in agreement.  CODE STATUS: full TOTAL TIME TAKING CARE OF THIS PATIENT: 28 minutes.   POSSIBLE D/C IN 2-3DAYS, DEPENDING ON CLINICAL CONDITION.   Hower,  Mardi Mainland.D on 07/27/2015 at 11:30 AM  Between 7am to 6pm - Pager - [phone]  After 6pm: House Pager: - [phone]  [facility]  Office  [phone]  CC: Primary care physician; Matthew Moss, MD

## 2015-07-27 NOTE — Progress Notes (Signed)
[facility]  ROUNDING NOTE   Subjective:   Admitted yesterday from Nephrology clinic for acute exacerbation of congestive heart failure.  Placed on IV furosemide gtt. UOP 1800  Creatinine 2.62 (2.91)  Several family members at bedside.   Objective:  Vital signs in last 24 hours:  Temp:  [97.4 F (36.3 C)-97.8 F (36.6 C)] 97.4 F (36.3 C) (04/22 0547) Pulse Rate:  [58-61] 61 (04/22 0547) Resp:  [18-20] 18 (04/22 0547) BP: (87-106)/(56-68) 106/68 mmHg (04/22 0547) SpO2:  [95 %-100 %] 95 % (04/22 0547) Weight:  [89.177 kg (196 lb 9.6 oz)-90.629 kg (199 lb 12.8 oz)] 89.177 kg (196 lb 9.6 oz) (04/22 0547)  Weight change:  Filed Weights   07/26/15 1334 07/27/15 0547  Weight: 90.629 kg (199 lb 12.8 oz) 89.177 kg (196 lb 9.6 oz)    Intake/Output: I/O last 3 completed shifts: In: 300 [P.O.:240; I.V.:60] Out: 1800 [Urine:1800]   Intake/Output this shift:  Total I/O In: 360 [P.O.:360] Out: 500 [Urine:500]  Physical Exam: General: NAD, sitting up in bed  Head: Normocephalic, atraumatic. Moist oral mucosal membranes  Eyes: Anicteric, PERRL, +conjuctival hemorrhage  Neck: Supple, trachea midline  Lungs:  Clear to auscultation  Heart: Regular rate and rhythm  Abdomen:  Soft, nontender,   Extremities: + peripheral edema.  Neurologic: Nonfocal, moving all four extremities  Skin: No lesions       Basic Metabolic Panel:  Recent Labs Lab 07/26/15 1424 07/27/15 0557  NA 123* 127*  K 4.5 4.4  CL 85* 86*  CO2 26 29  GLUCOSE 122* 97  BUN 101* 102*  CREATININE 2.91* 2.62*  CALCIUM 8.6* 9.0    Liver Function Tests: No results for input(s): AST, ALT, ALKPHOS, BILITOT, PROT, ALBUMIN in the last 168 hours. No results for input(s): LIPASE, AMYLASE in the last 168 hours. No results for input(s): AMMONIA in the last 168 hours.  CBC:  Recent Labs Lab 07/26/15 1424 07/27/15 0557  WBC 5.7 6.0  HGB 11.9* 12.1*  HCT 35.2* 35.1*  MCV 93.9 93.3  PLT 122*  129*    Cardiac Enzymes: No results for input(s): CKTOTAL, CKMB, CKMBINDEX, TROPONINI in the last 168 hours.  BNP: Invalid input(s): POCBNP  CBG:  Recent Labs Lab 07/26/15 1632 07/26/15 2059 07/27/15 0730  GLUCAP 137* 180* 141*    Microbiology: Results for orders placed or performed in visit on 06/24/15  Microscopic Examination     Status: None   Collection Time: 06/25/15  4:27 PM  Result Value Ref Range Status   WBC, UA 0-5 0 -  5 /hpf Final   RBC, UA 0-2 0 -  2 /hpf Final   Epithelial Cells (non renal) 0-10 0 - 10 /hpf Final   Casts None seen None seen /lpf Final   Mucus, UA Present Not Estab. Final   Bacteria, UA Few None seen/Few Final    Coagulation Studies: No results for input(s): LABPROT, INR in the last 72 hours.  Urinalysis: No results for input(s): COLORURINE, LABSPEC, PHURINE, GLUCOSEU, HGBUR, BILIRUBINUR, KETONESUR, PROTEINUR, UROBILINOGEN, NITRITE, LEUKOCYTESUR in the last 72 hours.  Invalid input(s): APPERANCEUR    Imaging: Dg Chest 2 View  07/26/2015  CLINICAL DATA:  CHF, COPD.  Atrial fib. EXAM: CHEST  2 VIEW COMPARISON:  08/20/2014 FINDINGS: Left defibrillator remains in place, unchanged. Prior CABG. Cardiomegaly. Small bilateral pleural effusions are stable since prior study. Left basilar scarring or chronic atelectasis. Minimal right base atelectasis. No overt edema. IMPRESSION: Stable chronic small bilateral pleural effusions,  left greater than right with chronic left base atelectasis or scarring and minimal right base atelectasis. Cardiomegaly. Electronically Signed   By: Rolm Baptise M.D.   On: 07/26/2015 16:38     Medications:   . furosemide (LASIX) infusion 5 mg/hr (07/27/15 0833)   . apixaban  2.5 mg Oral Daily  . aspirin EC  81 mg Oral Daily  . cholecalciferol  1,000 Units Oral Daily  . colesevelam  1,875 mg Oral BID  . docusate sodium  100 mg Oral BID  . ezetimibe  10 mg Oral Daily  . ferrous sulfate  325 mg Oral Q breakfast  .  finasteride  5 mg Oral QHS  . insulin aspart  0-5 Units Subcutaneous QHS  . insulin aspart  0-9 Units Subcutaneous TID WC  . insulin aspart  15 Units Subcutaneous TID WC  . magnesium oxide  400 mg Oral Daily  . omega-3 acid ethyl esters  2 capsule Oral BID  . pantoprazole  40 mg Oral Daily  . pravastatin  80 mg Oral QHS  . pyridOXINE  100 mg Oral Daily  . sodium bicarbonate  1,300 mg Oral TID  . sodium chloride flush  3 mL Intravenous Q12H  . tamsulosin  0.4 mg Oral BID  . tiotropium  18 mcg Inhalation Daily  . vitamin B-12  100 mcg Oral Daily   acetaminophen **OR** acetaminophen, ondansetron **OR** ondansetron (ZOFRAN) IV, polyethylene glycol  Assessment/ Plan:  [name] is a 80 y.o. white male with coronary artery disease status post CABG, CHF, hypertension, hyperlipidemia, COPD, diabetes mellitus type II, history of paroxysmal atrial fibrillation, BPH, ablation for atrial flutter,   1. Acute renal failure on chronic kidney disease stage IV: baseline creatinine of 2.13, eGFR of 29 from 07/08/15. Acute renal failure from acute cardiorenal syndrome. Chronic kidney disease secondary to diabetic nephropathy  2. Hyponatremia: secondary to congestive heart failure.   3. Acute exacerbation of systolic congestive heart failure: echo from yesterday with EF 20-25% - failed outpatient torsemide. Started on furomemide gtt with good response.   4. Secondary Hyperparathyroidism: PTH 85, calcium and phos at goal    LOS: [place], [place] 4/22/201711:22 AM

## 2015-07-28 LAB — BASIC METABOLIC PANEL
ANION GAP: 14 (ref 5–15)
BUN: 101 mg/dL — ABNORMAL HIGH (ref 6–20)
CHLORIDE: 85 mmol/L — AB (ref 101–111)
CO2: 27 mmol/L (ref 22–32)
Calcium: 8.9 mg/dL (ref 8.9–10.3)
Creatinine, Ser: 2.66 mg/dL — ABNORMAL HIGH (ref 0.61–1.24)
GFR calc non Af Amer: 21 mL/min — ABNORMAL LOW (ref >60)
GFR, EST AFRICAN AMERICAN: 25 mL/min — AB (ref >60)
Glucose, Bld: 119 mg/dL — ABNORMAL HIGH (ref 65–99)
Potassium: 4.5 mmol/L (ref 3.5–5.1)
SODIUM: 126 mmol/L — AB (ref 135–145)

## 2015-07-28 LAB — GLUCOSE, CAPILLARY
GLUCOSE-CAPILLARY: 60 mg/dL — AB (ref 65–99)
GLUCOSE-CAPILLARY: 111 mg/dL — AB (ref 65–99)
GLUCOSE-CAPILLARY: 218 mg/dL — AB (ref 65–99)
GLUCOSE-CAPILLARY: 194 mg/dL — AB (ref 65–99)
Glucose-Capillary: 166 mg/dL — ABNORMAL HIGH (ref 65–99)
Glucose-Capillary: 101 mg/dL — ABNORMAL HIGH (ref 65–99)

## 2015-07-28 LAB — RENAL FUNCTION PANEL
ALBUMIN: 3.5 g/dL (ref 3.5–5.0)
Anion gap: 13 (ref 5–15)
BUN: 98 mg/dL — AB (ref 6–20)
CALCIUM: 8.8 mg/dL — AB (ref 8.9–10.3)
CO2: 28 mmol/L (ref 22–32)
CREATININE: 2.54 mg/dL — AB (ref 0.61–1.24)
Chloride: 85 mmol/L — ABNORMAL LOW (ref 101–111)
GFR calc Af Amer: 26 mL/min — ABNORMAL LOW (ref >60)
GFR, EST NON AFRICAN AMERICAN: 23 mL/min — AB (ref >60)
Glucose, Bld: 130 mg/dL — ABNORMAL HIGH (ref 65–99)
PHOSPHORUS: 4.4 mg/dL (ref 2.5–4.6)
POTASSIUM: 4 mmol/L (ref 3.5–5.1)
SODIUM: 126 mmol/L — AB (ref 135–145)

## 2015-07-28 MED ORDER — FUROSEMIDE 10 MG/ML IJ SOLN
5.0000 mg/h | INTRAVENOUS | Status: DC
  Administered 2015-07-28: 5 mg/h via INTRAVENOUS
  Filled 2015-07-28: qty 25

## 2015-07-28 NOTE — Progress Notes (Signed)
Pt's family uncomfortable with administration of large amounts of insulin when CBG <120.  MD hower paged to let him know, no reply at this time

## 2015-07-28 NOTE — Progress Notes (Signed)
Pt. Wanted CBG checked, CBG was checked and was 60. Pt. Was asymptomatic. His skin was warm and dry. Pt was alert and oriented. A snack was given to him to eat. Will continue to monitor pt.

## 2015-07-28 NOTE — Progress Notes (Signed)
[facility]  ROUNDING NOTE   Subjective:   Family at bedside.  Furosemide gtt  UOP 2525  Objective:  Vital signs in last 24 hours:  Temp:  [97.5 F (36.4 C)-97.7 F (36.5 C)] 97.6 F (36.4 C) (04/23 0417) Pulse Rate:  [59-89] 60 (04/23 0417) Resp:  [16] 16 (04/23 0417) BP: (100-107)/(60-64) 100/60 mmHg (04/23 0417) SpO2:  [98 %-100 %] 100 % (04/23 0417) Weight:  [99.746 kg (219 lb 14.4 oz)] 99.746 kg (219 lb 14.4 oz) (04/23 0417)  Weight change: 9.117 kg (20 lb 1.6 oz) Filed Weights   07/26/15 1334 07/27/15 0547 07/28/15 0417  Weight: 90.629 kg (199 lb 12.8 oz) 89.177 kg (196 lb 9.6 oz) 99.746 kg (219 lb 14.4 oz)    Intake/Output: I/O last 3 completed shifts: In: 7124 [P.O.:1020; I.V.:143] Out: 4325 [Urine:4325]   Intake/Output this shift:  Total I/O In: 240 [P.O.:240] Out: 550 [Urine:550]  Physical Exam: General: NAD, sitting up in bed  Head: Normocephalic, atraumatic. Moist oral mucosal membranes  Eyes: Anicteric, PERRL, +conjuctival hemorrhage  Neck: Supple, trachea midline  Lungs:  Clear to auscultation  Heart: Regular rate and rhythm  Abdomen:  Soft, nontender,   Extremities: + peripheral edema.  Neurologic: Nonfocal, moving all four extremities  Skin: No lesions       Basic Metabolic Panel:  Recent Labs Lab 07/26/15 1424 07/27/15 0557 07/27/15 2344 07/28/15 0529  NA 123* 127* 126* 126*  K 4.5 4.4 4.5 4.0  CL 85* 86* 85* 85*  CO2 26 29 27 28   GLUCOSE 122* 97 119* 130*  BUN 101* 102* 101* 98*  CREATININE 2.91* 2.62* 2.66* 2.54*  CALCIUM 8.6* 9.0 8.9 8.8*  PHOS  --   --   --  4.4    Liver Function Tests:  Recent Labs Lab 07/28/15 0529  ALBUMIN 3.5   No results for input(s): LIPASE, AMYLASE in the last 168 hours. No results for input(s): AMMONIA in the last 168 hours.  CBC:  Recent Labs Lab 07/26/15 1424 07/27/15 0557  WBC 5.7 6.0  HGB 11.9* 12.1*  HCT 35.2* 35.1*  MCV 93.9 93.3  PLT 122* 129*    Cardiac  Enzymes: No results for input(s): CKTOTAL, CKMB, CKMBINDEX, TROPONINI in the last 168 hours.  BNP: Invalid input(s): POCBNP  CBG:  Recent Labs Lab 07/27/15 1603 07/27/15 2045 07/28/15 0410 07/28/15 0450 07/28/15 0731  GLUCAP 118* 102* 60* 111* 166*    Microbiology: Results for orders placed or performed in visit on 06/24/15  Microscopic Examination     Status: None   Collection Time: 06/25/15  4:27 PM  Result Value Ref Range Status   WBC, UA 0-5 0 -  5 /hpf Final   RBC, UA 0-2 0 -  2 /hpf Final   Epithelial Cells (non renal) 0-10 0 - 10 /hpf Final   Casts None seen None seen /lpf Final   Mucus, UA Present Not Estab. Final   Bacteria, UA Few None seen/Few Final    Coagulation Studies: No results for input(s): LABPROT, INR in the last 72 hours.  Urinalysis: No results for input(s): COLORURINE, LABSPEC, PHURINE, GLUCOSEU, HGBUR, BILIRUBINUR, KETONESUR, PROTEINUR, UROBILINOGEN, NITRITE, LEUKOCYTESUR in the last 72 hours.  Invalid input(s): APPERANCEUR    Imaging: Dg Chest 2 View  07/26/2015  CLINICAL DATA:  CHF, COPD.  Atrial fib. EXAM: CHEST  2 VIEW COMPARISON:  08/20/2014 FINDINGS: Left defibrillator remains in place, unchanged. Prior CABG. Cardiomegaly. Small bilateral pleural effusions are stable since prior study.  Left basilar scarring or chronic atelectasis. Minimal right base atelectasis. No overt edema. IMPRESSION: Stable chronic small bilateral pleural effusions, left greater than right with chronic left base atelectasis or scarring and minimal right base atelectasis. Cardiomegaly. Electronically Signed   By: Rolm Baptise M.D.   On: 07/26/2015 16:38     Medications:   . furosemide (LASIX) infusion 5 mg/hr (07/28/15 0816)   . apixaban  2.5 mg Oral Daily  . aspirin EC  81 mg Oral Daily  . cholecalciferol  1,000 Units Oral Daily  . colesevelam  1,875 mg Oral BID  . docusate sodium  100 mg Oral BID  . ezetimibe  10 mg Oral Daily  . ferrous sulfate  325 mg Oral  Q breakfast  . finasteride  5 mg Oral QHS  . insulin aspart  0-5 Units Subcutaneous QHS  . insulin aspart  0-9 Units Subcutaneous TID WC  . insulin aspart  15 Units Subcutaneous TID WC  . magnesium oxide  400 mg Oral Daily  . omega-3 acid ethyl esters  2 capsule Oral BID  . pantoprazole  40 mg Oral Daily  . pravastatin  80 mg Oral QHS  . pyridOXINE  100 mg Oral Daily  . sodium bicarbonate  1,300 mg Oral TID  . sodium chloride flush  3 mL Intravenous Q12H  . tamsulosin  0.4 mg Oral BID  . tiotropium  18 mcg Inhalation Daily  . vitamin B-12  100 mcg Oral Daily   acetaminophen **OR** acetaminophen, ondansetron **OR** ondansetron (ZOFRAN) IV, polyethylene glycol  Assessment/ Plan:  Mr. INOCENTE KRACH is a 81 y.o. white male with coronary artery disease status post CABG, CHF, hypertension, hyperlipidemia, COPD, diabetes mellitus type II, history of paroxysmal atrial fibrillation, BPH, ablation for atrial flutter,   1. Acute renal failure on chronic kidney disease stage IV: baseline creatinine of 2.13, eGFR of 29 from 07/08/15. Acute renal failure from acute cardiorenal syndrome. Chronic kidney disease secondary to diabetic nephropathy - currently holding telmisartan - monitor renal function and renally dose all medications.  - hold sodium bicarbonate  2. Hyponatremia: secondary to congestive heart failure and hypervolemia - fluid restriction  3. Acute exacerbation of systolic congestive heart failure: echo from this admission with EF 20-25% - failed outpatient torsemide. Started on furomemide gtt with good response.   4. Secondary Hyperparathyroidism: PTH 85, calcium and phos at goal  - not currently on any vitamin D agents.    LOS: 2 Javis Abboud 4/23/201710:17 AM

## 2015-07-28 NOTE — Progress Notes (Signed)
CGB rechecked 111. Will continue to monitor pt.

## 2015-07-28 NOTE — Care Management Important Message (Signed)
Important Message  Patient Details  Name: Matthew [name] MRN: [id number] Date of Birth: 1935/12/09   Medicare Important Message Given:  Yes    Sharod Petsch A, RN 07/28/2015, 2:11 PM

## 2015-07-28 NOTE — Progress Notes (Signed)
Lasix drip at 585ml/hr was restarted at 0130 once order was clarified and re-ordered by Dr. Anne HahnWillis, verifying nurse was Merita NortonAdrianne, RN. Bar code was unscanable.

## 2015-07-28 NOTE — Progress Notes (Addendum)
PT up in hall PRN with family, no pain issues, continues to diurese, no other issues at this time, family prefers to hold SSI most of the time, they feel 15 units w/meals is adequate to control blood sugar, pt was hypoglycemic this AM so family has concerns with administering too much insulin

## 2015-07-28 NOTE — Progress Notes (Signed)
[facility]  SUBJECTIVE: feels a little better   Filed Vitals:   07/27/15 1837 07/27/15 1953 07/28/15 0417 07/28/15 1116  BP: 107/64 100/60 100/60 113/73  Pulse: 89 59 60 91  Temp:  97.7 F (36.5 C) 97.6 F (36.4 C) 97.6 F (36.4 C)  TempSrc:  Oral Oral Oral  Resp:  16 16 22   Height:      Weight:   99.746 kg (219 lb 14.4 oz)   SpO2:  98% 100% 97%    Intake/Output Summary (Last 24 hours) at 07/28/15 1554 Last data filed at 07/28/15 1200  Gross per 24 hour  Intake    983 ml  Output   1675 ml  Net   -692 ml    LABS: Basic Metabolic Panel:  Recent Labs  16/01/9603/22/17 2344 07/28/15 0529  NA 126* 126*  K 4.5 4.0  CL 85* 85*  CO2 27 28  GLUCOSE 119* 130*  BUN 101* 98*  CREATININE 2.66* 2.54*  CALCIUM 8.9 8.8*  PHOS  --  4.4   Liver Function Tests:  Recent Labs  07/28/15 0529  ALBUMIN 3.5   No results for input(s): LIPASE, AMYLASE in the last 72 hours. CBC:  Recent Labs  07/26/15 1424 07/27/15 0557  WBC 5.7 6.0  HGB 11.9* 12.1*  HCT 35.2* 35.1*  MCV 93.9 93.3  PLT 122* 129*   Cardiac Enzymes: No results for input(s): CKTOTAL, CKMB, CKMBINDEX, TROPONINI in the last 72 hours. BNP: Invalid input(s): POCBNP D-Dimer: No results for input(s): DDIMER in the last 72 hours. Hemoglobin A1C: No results for input(s): HGBA1C in the last 72 hours. Fasting Lipid Panel: No results for input(s): CHOL, HDL, LDLCALC, TRIG, CHOLHDL, LDLDIRECT in the last 72 hours. Thyroid Function Tests: No results for input(s): TSH, T4TOTAL, T3FREE, THYROIDAB in the last 72 hours.  Invalid input(s): FREET3 Anemia Panel: No results for input(s): VITAMINB12, FOLATE, FERRITIN, TIBC, IRON, RETICCTPCT in the last 72 hours.   Physical Exam: Blood pressure 113/73, pulse 91, temperature 97.6 F (36.4 C), temperature source Oral, resp. rate 22, height 5\' 11"  (1.803 m), weight 99.746 kg (219 lb 14.4 oz), SpO2 97 %.   Wt Readings from Last 1 Encounters:   07/28/15 99.746 kg (219 lb 14.4 oz)     General appearance: alert and cooperative Resp: clear to auscultation bilaterally Cardio: regular rate and rhythm GI: soft, non-tender; bowel sounds normal; no masses,  no organomegaly Extremities: edema 1+ Neurologic: Grossly normal  TELEMETRY: Reviewed telemetry pt in nsr:  ASSESSMENT AND PLAN:  Principal Problem:   Acute exacerbation of CHF (congestive heart failure) (HCC)-improving with lasix drip. Will continue. Renal funciton improved somewhat also. Appreciate nephrolgy care Active Problems:   CHF exacerbation (HCC)    Dalia HeadingFATH,Prachi Oftedahl A., MD, [facility]istonFACC 07/28/2015 3:54 PM

## 2015-07-28 NOTE — Progress Notes (Signed)
[facility] [place] [facility]   PATIENT NAME: Demarea Lorey    MRN#:  [id number]  DATE OF BIRTH:  1936/03/28  SUBJECTIVE:  Hospital Day: 2 days Riki Gehring is a 80 y.o. male presenting with Edema.   Overnight events: No overnight events Interval Events: Diuresing well on Lasix drip no complaints, Family at bedside  REVIEW OF SYSTEMS:  CONSTITUTIONAL: No fever, fatigue or weakness.  EYES: No blurred or double vision.  EARS, NOSE, AND THROAT: No tinnitus or ear pain.  RESPIRATORY: No cough, shortness of breath, wheezing or hemoptysis.  CARDIOVASCULAR: No chest pain, orthopnea,Positive edema.  GASTROINTESTINAL: No nausea, vomiting, diarrhea or abdominal pain.  GENITOURINARY: No dysuria, hematuria.  ENDOCRINE: No polyuria, nocturia,  HEMATOLOGY: No anemia, easy bruising or bleeding SKIN: No rash or lesion. MUSCULOSKELETAL: No joint pain or arthritis.   NEUROLOGIC: No tingling, numbness, weakness.  PSYCHIATRY: No anxiety or depression.   DRUG ALLERGIES:   Allergies  Allergen Reactions  . Heparin Other (See Comments)    Reaction:  Unknown   . Lipitor [Atorvastatin] Other (See Comments)    Reaction:  Memory issues   . Toujeo Solostar [Insulin Glargine] Other (See Comments)    Reaction:  Weight gain     VITALS:  Blood pressure 100/60, pulse 60, temperature 97.6 F (36.4 C), temperature source Oral, resp. rate 16, height  (1.803 m), weight 99.746 kg (219 lb 14.4 oz), SpO2 100 %.  PHYSICAL EXAMINATION:  VITAL SIGNS: Filed Vitals:   07/27/15 1953 07/28/15 0417  BP: 100/60 100/60  Pulse: 59 60  Temp: 97.7 F (36.5 C) 97.6 F (36.4 C)  Resp: 16 16   GENERAL:80 y.o.male currently in no acute distress.  HEAD: Normocephalic, atraumatic.  EYES: Pupils equal, round, reactive to light. Extraocular muscles intact. No scleral icterus.  MOUTH: Moist mucosal membrane. Dentition intact. No abscess noted.  EAR, NOSE, THROAT: Clear without  exudates. No external lesions.  NECK: Supple. No thyromegaly. No nodules. No JVD.  PULMONARY: Clear to ascultation, without wheeze rails or rhonci. No use of accessory muscles, Good respiratory effort. good air entry bilaterally CHEST: Nontender to palpation.  CARDIOVASCULAR: S1 and S2. Regular rate and rhythm. No murmurs, rubs, or gallops. Gradually improving 2+ edema. Pedal pulses 2+ bilaterally.  GASTROINTESTINAL: Soft, nontender, nondistended. No masses. Positive bowel sounds. No hepatosplenomegaly.  MUSCULOSKELETAL: No swelling, clubbing, or edema. Range of motion full in all extremities.  NEUROLOGIC: Cranial nerves II through XII are intact. No gross focal neurological deficits. Sensation intact. Reflexes intact.  SKIN: Left lower extremity dressing in place over broken bullae otherwise No ulceration, lesions, rashes, or cyanosis. Skin warm and dry. Turgor intact.  PSYCHIATRIC: Mood, affect within normal limits. The patient is awake, alert and oriented x 3. Insight, judgment intact.      LABORATORY PANEL:   CBC  Recent Labs Lab 07/27/15 0557  WBC 6.0  HGB 12.1*  HCT 35.1*  PLT 129*   ------------------------------------------------------------------------------------------------------------------  Chemistries   Recent Labs Lab 07/28/15 0529  NA 126*  K 4.0  CL 85*  CO2 28  GLUCOSE 130*  BUN 98*  CREATININE 2.54*  CALCIUM 8.8*   ------------------------------------------------------------------------------------------------------------------  Cardiac Enzymes No results for input(s): TROPONINI in the last 168 hours. ------------------------------------------------------------------------------------------------------------------  RADIOLOGY:  Dg Chest 2 View  07/26/2015  CLINICAL DATA:  CHF, COPD.  Atrial fib. EXAM: CHEST  2 VIEW COMPARISON:  08/20/2014 FINDINGS: Left defibrillator remains in place, unchanged. Prior CABG. Cardiomegaly. Small bilateral pleural  effusions  are stable since prior study. Left basilar scarring or chronic atelectasis. Minimal right base atelectasis. No overt edema. IMPRESSION: Stable chronic small bilateral pleural effusions, left greater than right with chronic left base atelectasis or scarring and minimal right base atelectasis. Cardiomegaly. Electronically Signed   By: Charlett NoseKevin  Dover M.D.   On: 07/26/2015 16:38    EKG:   Orders placed or performed during the hospital encounter of 07/26/15  . EKG 12-Lead  . EKG 12-Lead    ASSESSMENT AND PLAN:   Jari PiggWilliam Camilo is a 80 y.o. male presenting with No chief complaint on file. . Admitted 07/26/2015 : Day #: 2 days 1 acute on chronic systolic CHF exacerbation- Ejection fraction 20-25-Ischemic cardiomyopathy.  Lasix drip -Daily weights, strict input and output monitoring   2 acute renal failure on CK D stage III-baseline GFR is 31,  -Nephrology consulted. Hold nephrotoxic medications.    3 CAD status post CABG-appears stable at this time. Status post bypass surgery. -Cardiac meds on hold due to hypotension. Monitor closely.   4 atrial flutter paroxysmal-status post cardioversion and ablation in the past. Monitor on telemetry. -Hold Coreg now due to low blood pressure. -Continue eliquis for anticoagulation  5GERD without esophagitis-on Protonix  6 hyperlipidemia-continue home medications  7 DVT therapeutic eliquis All the records are reviewed and case discussed with Care Management/Social Workerr. Management plans discussed with the patient, family and they are in agreement.  CODE STATUS: full TOTAL TIME TAKING CARE OF THIS PATIENT: 28 minutes.   POSSIBLE D/C IN 2-3DAYS, DEPENDING ON CLINICAL CONDITION.   Hower,  Mardi MainlandDavid K M.D on 07/28/2015 at 11:08 AM  Between 7am to 6pm - Pager - [phone]  After 6pm: House Pager: - [phone]  [facility]  Office  [phone]  CC: Primary care physician; Vonita MossMark Crissman, MD

## 2015-07-29 ENCOUNTER — Other Ambulatory Visit: Payer: Self-pay | Admitting: *Deleted

## 2015-07-29 LAB — RENAL FUNCTION PANEL
ALBUMIN: 3.5 g/dL (ref 3.5–5.0)
Anion gap: 13 (ref 5–15)
BUN: 91 mg/dL — ABNORMAL HIGH (ref 6–20)
CALCIUM: 9 mg/dL (ref 8.9–10.3)
CO2: 30 mmol/L (ref 22–32)
CREATININE: 2.45 mg/dL — AB (ref 0.61–1.24)
Chloride: 86 mmol/L — ABNORMAL LOW (ref 101–111)
GFR, EST AFRICAN AMERICAN: 27 mL/min — AB (ref >60)
GFR, EST NON AFRICAN AMERICAN: 24 mL/min — AB (ref >60)
Glucose, Bld: 180 mg/dL — ABNORMAL HIGH (ref 65–99)
Phosphorus: 3.9 mg/dL (ref 2.5–4.6)
Potassium: 4 mmol/L (ref 3.5–5.1)
SODIUM: 129 mmol/L — AB (ref 135–145)

## 2015-07-29 LAB — GLUCOSE, CAPILLARY
GLUCOSE-CAPILLARY: 248 mg/dL — AB (ref 65–99)
Glucose-Capillary: 156 mg/dL — ABNORMAL HIGH (ref 65–99)
Glucose-Capillary: 358 mg/dL — ABNORMAL HIGH (ref 65–99)
Glucose-Capillary: 218 mg/dL — ABNORMAL HIGH (ref 65–99)

## 2015-07-29 MED ORDER — APIXABAN 2.5 MG PO TABS
2.5000 mg | ORAL_TABLET | Freq: Two times a day (BID) | ORAL | Status: DC
  Administered 2015-07-29 – 2015-07-30 (×2): 2.5 mg via ORAL
  Filled 2015-07-29 (×2): qty 1

## 2015-07-29 MED ORDER — INSULIN ASPART 100 UNIT/ML ~~LOC~~ SOLN
0.0000 [IU] | Freq: Three times a day (TID) | SUBCUTANEOUS | Status: DC
  Administered 2015-07-29: 15 [IU] via SUBCUTANEOUS
  Administered 2015-07-30: 3 [IU] via SUBCUTANEOUS
  Administered 2015-07-30: 8 [IU] via SUBCUTANEOUS
  Filled 2015-07-29: qty 3
  Filled 2015-07-29: qty 15
  Filled 2015-07-29: qty 8

## 2015-07-29 NOTE — Progress Notes (Signed)
[facility]  ROUNDING NOTE   Subjective:   Family at bedside.  Furosemide gtt  UOP 2525  Objective:  Vital signs in last 24 hours:  Temp:  [97.4 F (36.3 C)-98 F (36.7 C)] 98 F (36.7 C) (04/24 1147) Pulse Rate:  [69-95] 69 (04/24 1147) Resp:  [18] 18 (04/24 1147) BP: (97-106)/(60-70) 99/60 mmHg (04/24 1147) SpO2:  [98 %-100 %] 100 % (04/24 1147) Weight:  [99.292 kg (218 lb 14.4 oz)] 99.292 kg (218 lb 14.4 oz) (04/24 0500)  Weight change: -0.454 kg (-1 lb) Filed Weights   07/27/15 0547 07/28/15 0417 07/29/15 0500  Weight: 89.177 kg (196 lb 9.6 oz) 99.746 kg (219 lb 14.4 oz) 99.292 kg (218 lb 14.4 oz)    Intake/Output: I/O last 3 completed shifts: In: 925 [P.O.:900; I.V.:25] Out: 3000 [Urine:3000]   Intake/Output this shift:  Total I/O In: 360 [P.O.:360] Out: 1800 [Urine:1800]  Physical Exam: General: NAD, sitting up in bed  Head: Normocephalic, atraumatic. Moist oral mucosal membranes  Eyes: Anicteric,  +conjuctival hemorrhage  Neck: Supple, trachea midline  Lungs:  Clear to auscultation  Heart: Regular rate and rhythm  Abdomen:  Soft, nontender,   Extremities: + peripheral edema.  Neurologic: Nonfocal, moving all four extremities  Skin: No lesions       Basic Metabolic Panel:  Recent Labs Lab 07/26/15 1424 07/27/15 0557 07/27/15 2344 07/28/15 0529 07/29/15 0426  NA 123* 127* 126* 126* 129*  K 4.5 4.4 4.5 4.0 4.0  CL 85* 86* 85* 85* 86*  CO2 _0 GLUCOSE 122* 97 119* 130* 180*  BUN 101* 102* 101* 98* 91*  CREATININE 2.91* 2.62* 2.66* 2.54* 2.45*  CALCIUM 8.6* 9.0 8.9 8.8* 9.0  PHOS  --   --   --  4.4 3.9    Liver Function Tests:  Recent Labs Lab 07/28/15 0529 07/29/15 0426  ALBUMIN 3.5 3.5   No results for input(s): LIPASE, AMYLASE in the last 168 hours. No results for input(s): AMMONIA in the last 168 hours.  CBC:  Recent Labs Lab 07/26/15 1424 07/27/15 0557  WBC 5.7 6.0  HGB 11.9* 12.1*  HCT 35.2*  35.1*  MCV 93.9 93.3  PLT 122* 129*    Cardiac Enzymes: No results for input(s): CKTOTAL, CKMB, CKMBINDEX, TROPONINI in the last 168 hours.  BNP: Invalid input(s): POCBNP  CBG:  Recent Labs Lab 07/28/15 1118 07/28/15 1608 07/28/15 2120 07/29/15 0731 07/29/15 1147  GLUCAP 218* 101* 194* 156* 248*    Microbiology: Results for orders placed or performed in visit on 06/24/15  Microscopic Examination     Status: None   Collection Time: 06/25/15  4:27 PM  Result Value Ref Range Status   WBC, UA 0-5 0 -  5 /hpf Final   RBC, UA 0-2 0 -  2 /hpf Final   Epithelial Cells (non renal) 0-10 0 - 10 /hpf Final   Casts None seen None seen /lpf Final   Mucus, UA Present Not Estab. Final   Bacteria, UA Few None seen/Few Final    Coagulation Studies: No results for input(s): LABPROT, INR in the last 72 hours.  Urinalysis: No results for input(s): COLORURINE, LABSPEC, PHURINE, GLUCOSEU, HGBUR, BILIRUBINUR, KETONESUR, PROTEINUR, UROBILINOGEN, NITRITE, LEUKOCYTESUR in the last 72 hours.  Invalid input(s): APPERANCEUR    Imaging: No results found.   Medications:   . furosemide (LASIX) infusion 5 mg/hr (07/29/15 0053)   . apixaban  2.5 mg Oral BID  . aspirin EC  81 mg Oral Daily  . cholecalciferol  1,000 Units Oral Daily  . colesevelam  1,875 mg Oral BID  . docusate sodium  100 mg Oral BID  . ezetimibe  10 mg Oral Daily  . ferrous sulfate  325 mg Oral Q breakfast  . finasteride  5 mg Oral QHS  . insulin aspart  0-15 Units Subcutaneous TID WC  . insulin aspart  0-5 Units Subcutaneous QHS  . magnesium oxide  400 mg Oral Daily  . omega-3 acid ethyl esters  2 capsule Oral BID  . pantoprazole  40 mg Oral Daily  . pravastatin  80 mg Oral QHS  . pyridOXINE  100 mg Oral Daily  . sodium chloride flush  3 mL Intravenous Q12H  . tamsulosin  0.4 mg Oral BID  . tiotropium  18 mcg Inhalation Daily  . vitamin B-12  100 mcg Oral Daily   acetaminophen **OR** acetaminophen, ondansetron  **OR** ondansetron (ZOFRAN) IV, polyethylene glycol  Assessment/ Plan:  [name] is a 80 y.o. white male with coronary artery disease status post CABG, CHF, hypertension, hyperlipidemia, COPD, diabetes mellitus type II, history of paroxysmal atrial fibrillation, BPH, ablation for atrial flutter,   1. Acute renal failure on chronic kidney disease stage IV: baseline creatinine of 2.13, eGFR of 29 from 07/08/15. Acute renal failure from acute cardiorenal syndrome. Chronic kidney disease secondary to diabetic nephropathy - currently holding telmisartan - monitor renal function and renally dose all medications.  - hold sodium bicarbonate  2. Hyponatremia: secondary to congestive heart failure and hypervolemia - fluid restriction  3. Acute exacerbation of systolic congestive heart failure: echo from this admission with EF 20-25% - failed outpatient torsemide.  Started on furomemide gtt with good response.  Possibly convert back to oral Torsemide on Tuesday  4. Secondary Hyperparathyroidism: PTH 85, calcium and phos at goal  - not currently on any vitamin D agents.    LOS: 3 Matthew [name] 4/24/20173:19 PM

## 2015-07-29 NOTE — Consult Note (Signed)
   [facility] Inpatient Consult   07/29/2015  NICKO DAHER [birth year] [id number]   Patient eligible for [place] with diagnosis of HF, and DM for post hospital discharge follow up. Patient was evaluated for community based chronic disease management services with [facility] care Management Program as a benefit of patient's [employer]. Met with the patient, spouse and daughter at the bedside to explain [place] Management services. Patient endorses his primary care provider to be Dr. Golden Pop. Patient states he is unsure of his discharge plan but would like to go home. Consent form signed.  Patient states his best contact number is [phone] and gave written permission to talk with his spouse Jeyren Danowski.Patient will receive post hospital discharge calls and be evaluated for monthly home visits. [facility] Care Management services does not interfere with or replace any services arranged by the inpatient care management team. RNCM left contact information and [facility] literature at the bedside. Made inpatient RNCM aware that [facility] will be following for care management. For additional questions please contact:   Verne Cove RN, [facility] Liaison  [phone]) Business [employer] [phone]) Toll free office

## 2015-07-29 NOTE — Progress Notes (Signed)
[facility] - [place] at [facility]   PATIENT NAME: Matthew [name]    MRN#:  [id number]  DATE OF BIRTH:  1935-05-12  SUBJECTIVE:  Hospital Day: 3 days Matthew [name] is a 80 y.o. male presenting with Edema.   Overnight events: No overnight events Interval Events: Diuresing well on Lasix drip no complaints, Family at bedside  REVIEW OF SYSTEMS:  CONSTITUTIONAL: No fever, fatigue or weakness.  EYES: No blurred or double vision.  EARS, NOSE, AND THROAT: No tinnitus or ear pain.  RESPIRATORY: No cough, shortness of breath, wheezing or hemoptysis.  CARDIOVASCULAR: No chest pain, orthopnea,Positive edema.  GASTROINTESTINAL: No nausea, vomiting, diarrhea or abdominal pain.  GENITOURINARY: No dysuria, hematuria.  ENDOCRINE: No polyuria, nocturia,  HEMATOLOGY: No anemia, easy bruising or bleeding SKIN: No rash or lesion. MUSCULOSKELETAL: No joint pain or arthritis.   NEUROLOGIC: No tingling, numbness, weakness.  PSYCHIATRY: No anxiety or depression.   DRUG ALLERGIES:   Allergies  Allergen Reactions  . Heparin Other (See Comments)    Reaction:  Unknown   . Lipitor [Atorvastatin] Other (See Comments)    Reaction:  Memory issues   . Toujeo Solostar [Insulin Glargine] Other (See Comments)    Reaction:  Weight gain     VITALS:  Blood pressure 99/60, pulse 69, temperature 98 F (36.7 C), temperature source Oral, resp. rate 18, height  (1.803 m), weight 99.292 kg (218 lb 14.4 oz), SpO2 100 %.  PHYSICAL EXAMINATION:  VITAL SIGNS: Filed Vitals:   07/29/15 0454 07/29/15 1147  BP: 97/62 99/60  Pulse: 95 69  Temp: 97.5 F (36.4 C) 98 F (36.7 C)  Resp: 18 18   GENERAL:80 y.o.male currently in no acute distress.  HEAD: Normocephalic, atraumatic.  EYES: Pupils equal, round, reactive to light. Extraocular muscles intact. No scleral icterus.  MOUTH: Moist mucosal membrane. Dentition intact. No abscess noted.  EAR, NOSE, THROAT: Clear without  exudates. No external lesions.  NECK: Supple. No thyromegaly. No nodules. No JVD.  PULMONARY: Clear to ascultation, without wheeze rails or rhonci. No use of accessory muscles, Good respiratory effort. good air entry bilaterally CHEST: Nontender to palpation.  CARDIOVASCULAR: S1 and S2. Regular rate and rhythm. No murmurs, rubs, or gallops. Gradually improving 1+ edema. Pedal pulses 2+ bilaterally.  GASTROINTESTINAL: Soft, nontender, nondistended. No masses. Positive bowel sounds. No hepatosplenomegaly.  MUSCULOSKELETAL: No swelling, clubbing, or edema. Range of motion full in all extremities.  NEUROLOGIC: Cranial nerves II through XII are intact. No gross focal neurological deficits. Sensation intact. Reflexes intact.  SKIN: Left lower extremity dressing in place over unbroken bullae no surrounding erythema otherwise No ulceration, lesions, rashes, or cyanosis. Skin warm and dry. Turgor intact.  PSYCHIATRIC: Mood, affect within normal limits. The patient is awake, alert and oriented x 3. Insight, judgment intact.      LABORATORY PANEL:   CBC  Recent Labs Lab 07/27/15 0557  WBC 6.0  HGB 12.1*  HCT 35.1*  PLT 129*   ------------------------------------------------------------------------------------------------------------------  Chemistries   Recent Labs Lab 07/29/15 0426  NA 129*  K 4.0  CL 86*  CO2 30  GLUCOSE 180*  BUN 91*  CREATININE 2.45*  CALCIUM 9.0   ------------------------------------------------------------------------------------------------------------------  Cardiac Enzymes No results for input(s): TROPONINI in the last 168 hours. ------------------------------------------------------------------------------------------------------------------  RADIOLOGY:  No results found.  EKG:   Orders placed or performed during the hospital encounter of 07/26/15  . EKG 12-Lead  . EKG 12-Lead    ASSESSMENT AND PLAN:   Matthew [name]  is a 80 y.o. male  presenting with No chief complaint on file. . Admitted 07/26/2015 : Day #: 3 days 1 acute on chronic systolic CHF exacerbation- Ejection fraction 20-25-Ischemic cardiomyopathy.  Lasix drip per nephrology/cardiology -Daily weights, strict input and output monitoring   2 acute renal failure on CK D stage III-baseline GFR is 31,  -Nephrology consulted. Hold nephrotoxic medications.  3. Type 2 diabetes insulin requiring, with hyperglycemia: I canceled pre-meal insulin given his general concern however now hyperglycemia with continue with increased sliding scale coverage and follow  4 CAD status post CABG-appears stable at this time. Status post bypass surgery. -Cardiac meds on hold due to hypotension. Monitor closely.   5 atrial flutter paroxysmal-status post cardioversion and ablation in the past. Monitor on telemetry. -Hold Coreg now due to low blood pressure. -Continue eliquis for anticoagulation  6 GERD without esophagitis-on Protonix  7 hyperlipidemia-continue home medications  8 DVT therapeutic eliquis All the records are reviewed and case discussed with Care Management/Social Workerr. Management plans discussed with the patient, family and they are in agreement.  CODE STATUS: full TOTAL TIME TAKING CARE OF THIS PATIENT: 33 minutes.   POSSIBLE D/C IN 2-3DAYS, DEPENDING ON CLINICAL CONDITION.   Fatimata Talsma,  Mardi MainlandDavid K M.D on 07/29/2015 at 12:16 PM  Between 7am to 6pm - Pager - [phone]  After 6pm: House Pager: - [phone]  [facility]  Office  [phone]  CC: Primary care physician; Vonita MossMark Crissman, MD

## 2015-07-30 LAB — GLUCOSE, CAPILLARY
GLUCOSE-CAPILLARY: 105 mg/dL — AB (ref 65–99)
Glucose-Capillary: 200 mg/dL — ABNORMAL HIGH (ref 65–99)
Glucose-Capillary: 262 mg/dL — ABNORMAL HIGH (ref 65–99)

## 2015-07-30 LAB — BASIC METABOLIC PANEL
ANION GAP: 14 (ref 5–15)
BUN: 87 mg/dL — AB (ref 6–20)
CO2: 30 mmol/L (ref 22–32)
Calcium: 9.3 mg/dL (ref 8.9–10.3)
Chloride: 88 mmol/L — ABNORMAL LOW (ref 101–111)
Creatinine, Ser: 2.19 mg/dL — ABNORMAL HIGH (ref 0.61–1.24)
GFR calc Af Amer: 31 mL/min — ABNORMAL LOW (ref >60)
GFR calc non Af Amer: 27 mL/min — ABNORMAL LOW (ref >60)
GLUCOSE: 105 mg/dL — AB (ref 65–99)
POTASSIUM: 3.8 mmol/L (ref 3.5–5.1)
Sodium: 132 mmol/L — ABNORMAL LOW (ref 135–145)

## 2015-07-30 MED ORDER — TORSEMIDE 10 MG PO TABS: 30.0000 mg | ORAL_TABLET | Freq: Every day | ORAL | Status: DC

## 2015-07-30 MED ORDER — TORSEMIDE 20 MG PO TABS
30.0000 mg | ORAL_TABLET | Freq: Every day | ORAL | Status: DC
  Administered 2015-07-30: 30 mg via ORAL
  Filled 2015-07-30: qty 2

## 2015-07-30 NOTE — Discharge Instructions (Signed)
Heart Failure Clinic appointment on Aug 08, 2015 at 11:30am with Clarisa Kindredina Alven Alverio, FNP. Please call [phone] to reschedule.

## 2015-07-30 NOTE — Progress Notes (Signed)
Inpatient Diabetes Program Recommendations  AACE/ADA: New Consensus Statement on Inpatient Glycemic Control (2015)  Target Ranges:  Prepandial:   less than 140 mg/dL      Peak postprandial:   less than 180 mg/dL (1-2 hours)      Critically ill patients:  140 - 180 mg/dL  Results for Elmo PuttMALPASS, Rc H (MRN [id number]) as of 07/30/2015 11:13  Ref. Range 07/29/2015 07:31 07/29/2015 11:47 07/29/2015 16:34 07/29/2015 21:00 07/30/2015 04:46 07/30/2015 07:24  Glucose-Capillary Latest Ref Range: 65-99 mg/dL 409156 (H) 811248 (H) 914358 (H) 218 (H) 105 (H) 200 (H)   Review of Glycemic Control   Current orders for Inpatient glycemic control: Novolog 0-15 units TID with meals, Novolog 0-5 units QHS  Inpatient Diabetes Program Recommendations: Insulin - Meal Coverage: Please consider ordering Novolog 5 units TID with meals for meal coverage if patient eats at least 50% of meals.  Thanks, Orlando PennerMarie Riki Gehring, RN, MSN, CDE Diabetes Coordinator Inpatient Diabetes Program [phone] (Team Pager from 8am to 5pm) [phone] (AP office) [phone] [facility] office) [phone] [facility] office)

## 2015-07-30 NOTE — Care Management Important Message (Signed)
Important Message  Patient Details  Name: Matthew [name] MRN: [id number] Date of Birth: [birth year]   Medicare Important Message Given:  Yes    Olegario MessierKathy A Temekia Caskey 07/30/2015, 11:20 AM

## 2015-07-30 NOTE — Progress Notes (Signed)
Initial Heart Failure Clinic appointment scheduled for Aug 08, 2015 at 11:30am. Thank you.

## 2015-07-30 NOTE — Progress Notes (Signed)
[facility]  ROUNDING NOTE   Subjective:   Family at bedside.  Furosemide gtt  [address] is a little better  Objective:  Vital signs in last 24 hours:  Temp:  [97.4 F (36.3 C)-98 F (36.7 C)] 97.9 F (36.6 C) (04/25 0439) Pulse Rate:  [60-69] 62 (04/25 0456) Resp:  [18] 18 (04/25 0439) BP: (85-99)/(50-60) 93/50 mmHg (04/25 0456) SpO2:  [98 %-100 %] 100 % (04/25 0439) Weight:  [85.524 kg (188 lb 8.7 oz)-85.594 kg (188 lb 11.2 oz)] 85.524 kg (188 lb 8.7 oz) (04/25 0439)  Weight change: -13.699 kg (-30 lb 3.2 oz) Filed Weights   07/29/15 0500 07/29/15 1836 07/30/15 0439  Weight: 99.292 kg (218 lb 14.4 oz) 85.594 kg (188 lb 11.2 oz) 85.524 kg (188 lb 8.7 oz)    Intake/Output: I/O last 3 completed shifts: In: 480 [P.O.:480] Out: 4500 [Urine:4500]   Intake/Output this shift:  Total I/O In: 240 [P.O.:240] Out: -   Physical Exam: General: NAD, sitting up in bed  Head: Normocephalic, atraumatic. Moist oral mucosal membranes  Eyes: Anicteric,  +conjuctival hemorrhage  Neck: Supple, trachea midline  Lungs:  Crackles at bases  Heart: Regular rate and rhythm  Abdomen:  Soft, nontender,   Extremities: + peripheral edema.  Neurologic: Nonfocal, moving all four extremities  Skin: No lesions       Basic Metabolic Panel:  Recent Labs Lab 07/27/15 0557 07/27/15 2344 07/28/15 0529 07/29/15 0426 07/30/15 0505  NA 127* 126* 126* 129* 132*  K 4.4 4.5 4.0 4.0 3.8  CL 86* 85* 85* 86* 88*  CO2 29 27 28 30 30   GLUCOSE 97 119* 130* 180* 105*  BUN 102* 101* 98* 91* 87*  CREATININE 2.62* 2.66* 2.54* 2.45* 2.19*  CALCIUM 9.0 8.9 8.8* 9.0 9.3  PHOS  --   --  4.4 3.9  --     Liver Function Tests:  Recent Labs Lab 07/28/15 0529 07/29/15 0426  ALBUMIN 3.5 3.5   No results for input(s): LIPASE, AMYLASE in the last 168 hours. No results for input(s): AMMONIA in the last 168 hours.  CBC:  Recent Labs Lab 07/26/15 1424 07/27/15 0557  WBC 5.7 6.0   HGB 11.9* 12.1*  HCT 35.2* 35.1*  MCV 93.9 93.3  PLT 122* 129*    Cardiac Enzymes: No results for input(s): CKTOTAL, CKMB, CKMBINDEX, TROPONINI in the last 168 hours.  BNP: Invalid input(s): POCBNP  CBG:  Recent Labs Lab 07/29/15 1147 07/29/15 1634 07/29/15 2100 07/30/15 0446 07/30/15 0724  GLUCAP 248* 358* 218* 105* 200*    Microbiology: Results for orders placed or performed in visit on 06/24/15  Microscopic Examination     Status: None   Collection Time: 06/25/15  4:27 PM  Result Value Ref Range Status   WBC, UA 0-5 0 -  5 /hpf Final   RBC, UA 0-2 0 -  2 /hpf Final   Epithelial Cells (non renal) 0-10 0 - 10 /hpf Final   Casts None seen None seen /lpf Final   Mucus, UA Present Not Estab. Final   Bacteria, UA Few None seen/Few Final    Coagulation Studies: No results for input(s): LABPROT, INR in the last 72 hours.  Urinalysis: No results for input(s): COLORURINE, LABSPEC, PHURINE, GLUCOSEU, HGBUR, BILIRUBINUR, KETONESUR, PROTEINUR, UROBILINOGEN, NITRITE, LEUKOCYTESUR in the last 72 hours.  Invalid input(s): APPERANCEUR    Imaging: No results found.   Medications:     . apixaban  2.5 mg Oral BID  .  aspirin EC  81 mg Oral Daily  . cholecalciferol  1,000 Units Oral Daily  . colesevelam  1,875 mg Oral BID  . docusate sodium  100 mg Oral BID  . ezetimibe  10 mg Oral Daily  . ferrous sulfate  325 mg Oral Q breakfast  . finasteride  5 mg Oral QHS  . insulin aspart  0-15 Units Subcutaneous TID WC  . insulin aspart  0-5 Units Subcutaneous QHS  . magnesium oxide  400 mg Oral Daily  . omega-3 acid ethyl esters  2 capsule Oral BID  . pantoprazole  40 mg Oral Daily  . pravastatin  80 mg Oral QHS  . pyridOXINE  100 mg Oral Daily  . sodium chloride flush  3 mL Intravenous Q12H  . tamsulosin  0.4 mg Oral BID  . tiotropium  18 mcg Inhalation Daily  . torsemide  30 mg Oral Daily  . vitamin B-12  100 mcg Oral Daily   acetaminophen **OR** acetaminophen,  ondansetron **OR** ondansetron (ZOFRAN) IV, polyethylene glycol  Assessment/ Plan:  Matthew [name] is a 80 y.o. white male with coronary artery disease status post CABG, CHF, hypertension, hyperlipidemia, COPD, diabetes mellitus type II, history of paroxysmal atrial fibrillation, BPH, ablation for atrial flutter,   1. Acute renal failure on chronic kidney disease stage IV: baseline creatinine of 2.13, eGFR of 29 from 07/08/15. Acute renal failure from acute cardiorenal syndrome. Chronic kidney disease secondary to diabetic nephropathy - continue to hold telmisartan- can consider restarting as outpatient - monitor renal function and renally dose all medications.  - hold sodium bicarbonate  2. Hyponatremia: secondary to congestive heart failure and hypervolemia - fluid restriction  3. Acute exacerbation of systolic congestive heart failure: echo from this admission with EF 20-25% -   furomemide gtt with good response.  - can discharge on Torsemide 30 mg daily - f/u with Matthew [name] as outpatient       LOS: 4 Matthew [name] 4/25/201711:15 AM

## 2015-07-30 NOTE — Progress Notes (Signed)
Pt. Discharged to home via wc. Discharge instructions and medication regimen reviewed at bedside with patient and wife. Both verbalize understanding of instructions and medication regimen. Prescriptions included with d/c papers. Patient assessment unchanged from this morning. TELE and IV discontinued per policy.

## 2015-07-30 NOTE — Discharge Summary (Signed)
[facility] - [place] at [facility]   PATIENT NAME: Matthew [name]    MR#:  [id number]  DATE OF BIRTH:  05/20/1935  DATE OF ADMISSION:  07/26/2015 ADMITTING PHYSICIAN: Enid Baas, MD  DATE OF DISCHARGE: 07/30/2015  PRIMARY CARE PHYSICIAN: Vonita Moss, MD    ADMISSION DIAGNOSIS:  Edema chf  DISCHARGE DIAGNOSIS:  Acute on chronic systolic congestive heart failure Acute renal failure on chronic kidney disease stage IV SECONDARY DIAGNOSIS:   Past Medical History  Diagnosis Date  . CAD (coronary artery disease)     s/p CABG  . Cerebral vascular accident [facility])     with residual right sided weakness  . Restrictive lung disease     Not on home o2  . A-fib [facility])     s/p ablation  . COPD (chronic obstructive pulmonary disease) (HCC)   . Chronic kidney disease     stage 3, GFR 31 at baseline  . Diabetes mellitus without complication (HCC)   . Type 2 diabetes mellitus with stage 3 chronic kidney disease (HCC)   . Cardiomyopathy (HCC)     Last EF 15% in Nov 2016  . Hyperlipidemia   . Hypertension   . Diabetic retinopathy (HCC)   . BPH (benign prostatic hyperplasia)     HOSPITAL COURSE:  Matthew [name]  is a 80 y.o. male admitted 07/26/2015 with chief complaint leg edema. Please see H&P performed by Enid Baas, MD for further information. Patient presented with above complaints. Failed outpatient treatment in regards to worsening edema. On admission started on lasix drip with the help of nephrology and cardiology. In total he diuresed about 8L total. Symptoms much improved.  DISCHARGE CONDITIONS:   stable  CONSULTS OBTAINED:  Treatment Team:  Dalia Heading, MD Lamont Dowdy, MD  DRUG ALLERGIES:   Allergies  Allergen Reactions  . Heparin Other (See Comments)    Reaction:  Unknown   . Lipitor [Atorvastatin] Other (See Comments)    Reaction:  Memory issues   . Toujeo Solostar [Insulin Glargine] Other (See Comments)    Reaction:   Weight gain     DISCHARGE MEDICATIONS:   Current Discharge Medication List    CONTINUE these medications which have CHANGED   Details  torsemide (DEMADEX) 10 MG tablet Take 3 tablets (30 mg total) by mouth daily. Qty: 90 tablet, Refills: 0      CONTINUE these medications which have NOT CHANGED   Details  apixaban (ELIQUIS) 2.5 MG TABS tablet Take 2.5 mg by mouth 2 (two) times daily.     aspirin EC 81 MG tablet Take 81 mg by mouth daily.    Associated Diagnoses: Coronary artery disease due to lipid rich plaque; Essential hypertension; Type 2 diabetes mellitus with stage 3 chronic kidney disease (HCC)    B Complex-C (B-COMPLEX WITH VITAMIN C) tablet Take 1 tablet by mouth daily.    carvedilol (COREG) 3.125 MG tablet Take 3.125 mg by mouth 2 (two) times daily with a meal.    cholecalciferol (VITAMIN D) 1000 units tablet Take 1,000 Units by mouth daily.    colesevelam (WELCHOL) 625 MG tablet Take 3 tablets (1,875 mg total) by mouth 2 (two) times daily. Qty: 180 tablet, Refills: 12   Associated Diagnoses: Type 2 diabetes mellitus with stage 3 chronic kidney disease (HCC); Hyperlipidemia    ezetimibe (ZETIA) 10 MG tablet Take 1 tablet (10 mg total) by mouth daily. Qty: 30 tablet, Refills: 12   Associated Diagnoses: Hyperlipidemia    ferrous  sulfate 325 (65 FE) MG tablet Take 325 mg by mouth daily with breakfast.    Associated Diagnoses: Coronary artery disease due to lipid rich plaque; Essential hypertension; Type 2 diabetes mellitus with stage 3 chronic kidney disease (HCC)    finasteride (PROSCAR) 5 MG tablet Take 1 tablet (5 mg total) by mouth at bedtime. Qty: 30 tablet, Refills: 12    insulin lispro (HUMALOG KWIKPEN) 100 UNIT/ML KiwkPen Inject 8 Units into the skin 2 (two) times daily.    Magnesium 250 MG TABS Take 250 mg by mouth daily.     omega-3 fish oil (MAXEPA) 1000 MG CAPS capsule Take 2 capsules by mouth 2 (two) times daily.    pantoprazole (PROTONIX) 40 MG tablet  Take 40 mg by mouth daily.    pravastatin (PRAVACHOL) 80 MG tablet Take 1 tablet (80 mg total) by mouth at bedtime. Qty: 30 tablet, Refills: 12   Associated Diagnoses: Hyperlipidemia    pyridOXINE (VITAMIN B-6) 50 MG tablet Take 50 mg by mouth daily.    sodium bicarbonate 650 MG tablet Take 1,300 mg by mouth 2 (two) times daily.     spironolactone (ALDACTONE) 25 MG tablet Take 12.5 mg by mouth daily.     tamsulosin (FLOMAX) 0.4 MG CAPS capsule Take 1 capsule (0.4 mg total) by mouth 2 (two) times daily. Qty: 60 capsule, Refills: 12    telmisartan (MICARDIS) 40 MG tablet Take 40 mg by mouth daily.    tiotropium (SPIRIVA) 18 MCG inhalation capsule Place 18 mcg into inhaler and inhale daily.    vitamin B-12 (CYANOCOBALAMIN) 100 MCG tablet Take 100 mcg by mouth daily.    Associated Diagnoses: Coronary artery disease due to lipid rich plaque; Essential hypertension; Type 2 diabetes mellitus with stage 3 chronic kidney disease (HCC)    vitamin E 400 UNIT capsule Take 400 Units by mouth daily.    Associated Diagnoses: Coronary artery disease due to lipid rich plaque; Essential hypertension; Type 2 diabetes mellitus with stage 3 chronic kidney disease (HCC)      STOP taking these medications     metolazone (ZAROXOLYN) 5 MG tablet          DISCHARGE INSTRUCTIONS:    DIET:  Cardiac diet  DISCHARGE CONDITION:  Stable  ACTIVITY:  Activity as tolerated  OXYGEN:  Home Oxygen: No.   Oxygen Delivery: room air  DISCHARGE LOCATION:  home   If you experience worsening of your admission symptoms, develop shortness of breath, life threatening emergency, suicidal or homicidal thoughts you must seek medical attention immediately by calling 911 or calling your MD immediately  if symptoms less severe.  You Must read complete instructions/literature along with all the possible adverse reactions/side effects for all the Medicines you take and that have been prescribed to you. Take any new  Medicines after you have completely understood and accpet all the possible adverse reactions/side effects.   Please note  You were cared for by a hospitalist during your hospital stay. If you have any questions about your discharge medications or the care you received while you were in the hospital after you are discharged, you can call the unit and asked to speak with the hospitalist on call if the hospitalist that took care of you is not available. Once you are discharged, your primary care physician will handle any further medical issues. Please note that NO REFILLS for any discharge medications will be authorized once you are discharged, as it is imperative that you return to your primary  care physician (or establish a relationship with a primary care physician if you do not have one) for your aftercare needs so that they can reassess your need for medications and monitor your lab values.    On the day of Discharge:   VITAL SIGNS:  Blood pressure 97/57, pulse 72, temperature 97.7 F (36.5 C), temperature source Oral, resp. rate 18, height 5\' 11"  (1.803 m), weight 84.777 kg (186 lb 14.4 oz), SpO2 100 %.  I/O:   Intake/Output Summary (Last 24 hours) at 07/30/15 1155 Last data filed at 07/30/15 1022  Gross per 24 hour  Intake    480 ml  Output   2500 ml  Net  -2020 ml    PHYSICAL EXAMINATION:  GENERAL:  80 y.o.-year-old patient lying in the bed with no acute distress.  EYES: Pupils equal, round, reactive to light and accommodation. No scleral icterus. Extraocular muscles intact.  HEENT: Head atraumatic, normocephalic. Oropharynx and nasopharynx clear.  NECK:  Supple, no jugular venous distention. No thyroid enlargement, no tenderness.  LUNGS: Normal breath sounds bilaterally, no wheezing, rales,rhonchi or crepitation. No use of accessory muscles of respiration.  CARDIOVASCULAR: S1, S2 normal. No murmurs, rubs, or gallops. Trace edema markedly improved ABDOMEN: Soft, non-tender,  non-distended. Bowel sounds present. No organomegaly or mass.  EXTREMITIES: No pedal edema, cyanosis, or clubbing.  NEUROLOGIC: Cranial nerves II through XII are intact. Muscle strength 5/5 in all extremities. Sensation intact. Gait not checked.  PSYCHIATRIC: The patient is alert and oriented x 3.  SKIN: No obvious rash, lesion, or ulcer.   DATA REVIEW:   CBC  Recent Labs Lab 07/27/15 0557  WBC 6.0  HGB 12.1*  HCT 35.1*  PLT 129*    Chemistries   Recent Labs Lab 07/30/15 0505  NA 132*  K 3.8  CL 88*  CO2 30  GLUCOSE 105*  BUN 87*  CREATININE 2.19*  CALCIUM 9.3    Cardiac Enzymes No results for input(s): TROPONINI in the last 168 hours.  Microbiology Results  Results for orders placed or performed in visit on 06/24/15  Microscopic Examination     Status: None   Collection Time: 06/25/15  4:27 PM  Result Value Ref Range Status   WBC, UA 0-5 0 -  5 /hpf Final   RBC, UA 0-2 0 -  2 /hpf Final   Epithelial Cells (non renal) 0-10 0 - 10 /hpf Final   Casts None seen None seen /lpf Final   Mucus, UA Present Not Estab. Final   Bacteria, UA Few None seen/Few Final    RADIOLOGY:  No results found.   Management plans discussed with the patient, family and they are in agreement.  CODE STATUS:     Code Status Orders        Start     Ordered   07/26/15 1346  Full code   Continuous     07/26/15 1347    Code Status History    Date Active Date Inactive Code Status Order ID Comments User Context   This patient has a current code status but no historical code status.    Advance Directive Documentation        Most Recent Value   Type of Advance Directive  Healthcare Power of Attorney, Living will   Pre-existing out of facility DNR order (yellow form or pink MOST form)     "MOST" Form in Place?        TOTAL TIME TAKING CARE OF THIS PATIENT: 28 minutes.  Hower,  Mardi Mainland.D on 07/30/2015 at 11:55 AM  Between 7am to 6pm - Pager - [phone]  After 6pm  go to [email or web address] - Social research officer, government  [facility]  Office  [phone]  CC: Primary care physician; Vonita Moss, MD

## 2015-07-30 NOTE — Progress Notes (Signed)
[facility]  SUBJECTIVE: Continuing to improve. Has diuresed approximately 8 liters since admission. . Renal function improving. Serum sodium improved . Down 3 liters over past 24 hours   Filed Vitals:   07/29/15 2002 07/30/15 0439 07/30/15 0454 07/30/15 0456  BP: 96/57 85/54 87/54  93/50  Pulse: 62 63 60 62  Temp: 97.4 F (36.3 C) 97.9 F (36.6 C)    TempSrc: Oral Oral    Resp: 18 18    Height:      Weight:  85.524 kg (188 lb 8.7 oz)    SpO2: 98% 100%      Intake/Output Summary (Last 24 hours) at 07/30/15 0745 Last data filed at 07/30/15 0448  Gross per 24 hour  Intake    480 ml  Output   3500 ml  Net  -3020 ml    LABS: Basic Metabolic Panel:  Recent Labs  46/96/2904/23/17 0529 07/29/15 0426 07/30/15 0505  NA 126* 129* 132*  K 4.0 4.0 3.8  CL 85* 86* 88*  CO2 28 30 30   GLUCOSE 130* 180* 105*  BUN 98* 91* 87*  CREATININE 2.54* 2.45* 2.19*  CALCIUM 8.8* 9.0 9.3  PHOS 4.4 3.9  --    Liver Function Tests:  Recent Labs  07/28/15 0529 07/29/15 0426  ALBUMIN 3.5 3.5   No results for input(s): LIPASE, AMYLASE in the last 72 hours. CBC: No results for input(s): WBC, NEUTROABS, HGB, HCT, MCV, PLT in the last 72 hours. Cardiac Enzymes: No results for input(s): CKTOTAL, CKMB, CKMBINDEX, TROPONINI in the last 72 hours. BNP: Invalid input(s): POCBNP D-Dimer: No results for input(s): DDIMER in the last 72 hours. Hemoglobin A1C: No results for input(s): HGBA1C in the last 72 hours. Fasting Lipid Panel: No results for input(s): CHOL, HDL, LDLCALC, TRIG, CHOLHDL, LDLDIRECT in the last 72 hours. Thyroid Function Tests: No results for input(s): TSH, T4TOTAL, T3FREE, THYROIDAB in the last 72 hours.  Invalid input(s): FREET3 Anemia Panel: No results for input(s): VITAMINB12, FOLATE, FERRITIN, TIBC, IRON, RETICCTPCT in the last 72 hours.   Physical Exam: Blood pressure 93/50, pulse 62, temperature 97.9 F (36.6 C), temperature source  Oral, resp. rate 18, height 5\' 11"  (1.803 m), weight 85.524 kg (188 lb 8.7 oz), SpO2 100 %.   Wt Readings from Last 1 Encounters:  07/30/15 85.524 kg (188 lb 8.7 oz)     General appearance: alert and cooperative Resp: clear to auscultation bilaterally Cardio: regular rate and rhythm and systolic murmur: early systolic 1/6, crescendo and decrescendo at lower left sternal border GI: soft, non-tender; bowel sounds normal; no masses,  no organomegaly Extremities: extremities normal, atraumatic, no cyanosis or edema Neurologic: Grossly normal  TELEMETRY: Reviewed telemetry pt in paced rhythm:  ASSESSMENT AND PLAN:  Principal Problem:   Acute exacerbation of CHF (congestive heart failure) (HCC)-improved markedly with lasix drip. Will discuss with nephrology but would consider discontinuing lasix drip and placing on oral loop diuretic. Was on torsemide at admission. Ambulate and consider discharge if stable. Will follw as outpatient Active Problems:   CHF exacerbation (HCC)    Dalia HeadingFATH,Dewon Mendizabal A., MD, [facility] 07/30/2015 7:45 AM

## 2015-07-30 NOTE — Care Management Note (Signed)
Case Management Note  Patient Details  Name: IZAIHA LO MRN: [id number] Date of Birth: 1935/04/24  Subjective/Objective: CM assessment for discharge planning. Patient up in the bathroom during conversation with wife. She states patient does not uses assistive devices, is independent and active. Patient still drives. PCP is Dr. Jeananne Rama. Denies issues accessing medications, copays or medical care.                    Action/Plan: Home with self care  Expected Discharge Date:    07/30/2015              Expected Discharge Plan:  Home/Self Care  In-House Referral:     Discharge planning Services  CM Consult, Homebound not met per provider  Post Acute Care Choice:    Choice offered to:     DME Arranged:    DME Agency:     HH Arranged:    HH Agency:     Status of Service:  Completed, signed off  Medicare Important Message Given:  Yes Date Medicare IM Given:    Medicare IM give by:    Date Additional Medicare IM Given:    Additional Medicare Important Message give by:     If discussed at [place] of Stay Meetings, dates discussed:    Additional Comments:  Jolly Mango, RN 07/30/2015, 12:01 PM

## 2015-07-31 ENCOUNTER — Encounter: Payer: Self-pay | Admitting: Family Medicine

## 2015-07-31 ENCOUNTER — Ambulatory Visit (INDEPENDENT_AMBULATORY_CARE_PROVIDER_SITE_OTHER): Payer: PPO | Admitting: Family Medicine

## 2015-07-31 ENCOUNTER — Other Ambulatory Visit: Payer: Self-pay | Admitting: *Deleted

## 2015-07-31 VITALS — BP 99/65 | HR 73 | Ht 70.2 in | Wt 193.0 lb

## 2015-07-31 DIAGNOSIS — N184 Chronic kidney disease, stage 4 (severe): Secondary | ICD-10-CM | POA: Diagnosis not present

## 2015-07-31 DIAGNOSIS — I5040 Unspecified combined systolic (congestive) and diastolic (congestive) heart failure: Secondary | ICD-10-CM

## 2015-07-31 DIAGNOSIS — I130 Hypertensive heart and chronic kidney disease with heart failure and stage 1 through stage 4 chronic kidney disease, or unspecified chronic kidney disease: Secondary | ICD-10-CM

## 2015-07-31 DIAGNOSIS — I5023 Acute on chronic systolic (congestive) heart failure: Secondary | ICD-10-CM | POA: Diagnosis not present

## 2015-07-31 NOTE — Progress Notes (Signed)
   BP 99/65 mmHg  Pulse 73  Ht 5' 10.2" (1.783 m)  Wt 193 lb (87.544 kg)  BMI 27.54 kg/m2  SpO2 99%   Subjective:    Patient ID: Matthew [name], male    DOB: 26-Nov-1935, 80 y.o.   MRN: [id number]  HPI: Matthew [name] is a 80 y.o. male  Chief Complaint  Patient presents with  . Hospitalization Follow-up    Reviewed hospital discharge summary patient medications practically the same CHF stable still having some edema with slight blistering of his legs patient ordinarily wear some support hose but doesn't really propping his legs up much. No PND orthopnea some nocturia Patient's starting to eat again is very low-salt diet Gaining strength not wobbly not using a cane Discuss fall risk Patient here with wife who assists with history Relevant past medical, surgical, family and social history reviewed and updated as indicated. Interim medical history since our last visit reviewed. Allergies and medications reviewed and updated.  Review of Systems  Constitutional: Positive for fatigue. Negative for fever, chills and diaphoresis.  Respiratory: Negative for apnea, cough, choking, chest tightness and shortness of breath.   Cardiovascular: Positive for palpitations and leg swelling. Negative for chest pain.    Per HPI unless specifically indicated above     Objective:    BP 99/65 mmHg  Pulse 73  Ht 5' 10.2" (1.783 m)  Wt 193 lb (87.544 kg)  BMI 27.54 kg/m2  SpO2 99%  Wt Readings from Last 3 Encounters:  07/31/15 193 lb (87.544 kg)  07/30/15 186 lb 14.4 oz (84.777 kg)  06/24/15 184 lb (83.462 kg)    Physical Exam  Constitutional: He is oriented to person, place, and time. He appears well-developed and well-nourished. No distress.  HENT:  Head: Normocephalic and atraumatic.  Right Ear: Hearing normal.  Left Ear: Hearing normal.  Nose: Nose normal.  Eyes: Lids are normal. Right eye exhibits no discharge. Left eye exhibits no discharge. No scleral icterus.  Conjunctival  hemorrhage on left  Cardiovascular: Normal rate, regular rhythm and normal heart sounds.   Pulmonary/Chest: Effort normal and breath sounds normal. No respiratory distress. He has no wheezes. He has no rales.  Musculoskeletal: Normal range of motion.  Neurological: He is alert and oriented to person, place, and time.  Skin: Skin is intact. No rash noted.  Psychiatric: He has a normal mood and affect. His speech is normal and behavior is normal. Judgment and thought content normal. Cognition and memory are normal.        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

## 2015-07-31 NOTE — Assessment & Plan Note (Signed)
Stable followed by cardiology

## 2015-07-31 NOTE — Assessment & Plan Note (Signed)
Followed by nephrology. 

## 2015-07-31 NOTE — Patient Outreach (Signed)
Transition of care call (week 1, discharged 4/25).   Person answering the phone reports pt is napping, requesting to call pt back tomorrow.  RN CM provided contact name and number with request to have pt return phone call.   If no response, will call pt again.     Shayne Alkenose M.   Pierzchala RN CCM [facility] Care Management  [phone]

## 2015-08-02 ENCOUNTER — Other Ambulatory Visit: Payer: Self-pay | Admitting: *Deleted

## 2015-08-02 NOTE — Patient Outreach (Signed)
Second attempt made to contact pt for transition of care (discharged 4/25).  First call made 4/26, told pt not available- RN CM provided name and contact number to return call, no call back.   If no response to call today, will try again.      Shayne Alkenose M.   Pierzchala RN CCM [facility] Care [facility]  [phone]

## 2015-08-06 ENCOUNTER — Telehealth: Payer: Self-pay | Admitting: Family Medicine

## 2015-08-06 NOTE — Telephone Encounter (Signed)
Crystal from [employer] called to informed they have tried to reach out to the pt on 2 separate occasions and have sent a letter to the pt attempting to follow up after his hospital stay. Pt has not followed with them or [employer]. Neither are getting a response from the pt. They wanted to let Dr. Dossie Arbourrissman know and confirm they had the correct contact information. Thanks.

## 2015-08-08 ENCOUNTER — Encounter: Payer: Self-pay | Admitting: Family

## 2015-08-08 ENCOUNTER — Ambulatory Visit: Payer: PPO | Attending: Family | Admitting: Family

## 2015-08-08 VITALS — BP 75/46 | HR 59 | Resp 18 | Ht 71.0 in | Wt 200.0 lb

## 2015-08-08 DIAGNOSIS — E785 Hyperlipidemia, unspecified: Secondary | ICD-10-CM | POA: Diagnosis not present

## 2015-08-08 DIAGNOSIS — I4891 Unspecified atrial fibrillation: Secondary | ICD-10-CM | POA: Diagnosis not present

## 2015-08-08 DIAGNOSIS — Z8249 Family history of ischemic heart disease and other diseases of the circulatory system: Secondary | ICD-10-CM | POA: Insufficient documentation

## 2015-08-08 DIAGNOSIS — Z79899 Other long term (current) drug therapy: Secondary | ICD-10-CM | POA: Insufficient documentation

## 2015-08-08 DIAGNOSIS — I129 Hypertensive chronic kidney disease with stage 1 through stage 4 chronic kidney disease, or unspecified chronic kidney disease: Secondary | ICD-10-CM | POA: Diagnosis not present

## 2015-08-08 DIAGNOSIS — E11319 Type 2 diabetes mellitus with unspecified diabetic retinopathy without macular edema: Secondary | ICD-10-CM | POA: Diagnosis not present

## 2015-08-08 DIAGNOSIS — Z8673 Personal history of transient ischemic attack (TIA), and cerebral infarction without residual deficits: Secondary | ICD-10-CM | POA: Insufficient documentation

## 2015-08-08 DIAGNOSIS — Z87891 Personal history of nicotine dependence: Secondary | ICD-10-CM | POA: Insufficient documentation

## 2015-08-08 DIAGNOSIS — I959 Hypotension, unspecified: Secondary | ICD-10-CM | POA: Diagnosis not present

## 2015-08-08 DIAGNOSIS — Z951 Presence of aortocoronary bypass graft: Secondary | ICD-10-CM | POA: Diagnosis not present

## 2015-08-08 DIAGNOSIS — Z9889 Other specified postprocedural states: Secondary | ICD-10-CM | POA: Diagnosis not present

## 2015-08-08 DIAGNOSIS — I482 Chronic atrial fibrillation, unspecified: Secondary | ICD-10-CM

## 2015-08-08 DIAGNOSIS — J45909 Unspecified asthma, uncomplicated: Secondary | ICD-10-CM | POA: Diagnosis not present

## 2015-08-08 DIAGNOSIS — Z888 Allergy status to other drugs, medicaments and biological substances status: Secondary | ICD-10-CM | POA: Diagnosis not present

## 2015-08-08 DIAGNOSIS — I429 Cardiomyopathy, unspecified: Secondary | ICD-10-CM | POA: Diagnosis not present

## 2015-08-08 DIAGNOSIS — E1122 Type 2 diabetes mellitus with diabetic chronic kidney disease: Secondary | ICD-10-CM | POA: Insufficient documentation

## 2015-08-08 DIAGNOSIS — Z7982 Long term (current) use of aspirin: Secondary | ICD-10-CM | POA: Diagnosis not present

## 2015-08-08 DIAGNOSIS — R001 Bradycardia, unspecified: Secondary | ICD-10-CM | POA: Diagnosis not present

## 2015-08-08 DIAGNOSIS — N400 Enlarged prostate without lower urinary tract symptoms: Secondary | ICD-10-CM | POA: Insufficient documentation

## 2015-08-08 DIAGNOSIS — Z794 Long term (current) use of insulin: Secondary | ICD-10-CM | POA: Insufficient documentation

## 2015-08-08 DIAGNOSIS — N183 Chronic kidney disease, stage 3 (moderate): Secondary | ICD-10-CM | POA: Insufficient documentation

## 2015-08-08 DIAGNOSIS — I2510 Atherosclerotic heart disease of native coronary artery without angina pectoris: Secondary | ICD-10-CM | POA: Diagnosis not present

## 2015-08-08 DIAGNOSIS — I5022 Chronic systolic (congestive) heart failure: Secondary | ICD-10-CM | POA: Diagnosis not present

## 2015-08-08 DIAGNOSIS — J449 Chronic obstructive pulmonary disease, unspecified: Secondary | ICD-10-CM | POA: Diagnosis not present

## 2015-08-08 DIAGNOSIS — I950 Idiopathic hypotension: Secondary | ICD-10-CM

## 2015-08-08 DIAGNOSIS — Z7901 Long term (current) use of anticoagulants: Secondary | ICD-10-CM | POA: Insufficient documentation

## 2015-08-08 NOTE — Progress Notes (Signed)
Subjective:    Patient ID: Matthew [name], male    DOB: June 12, 1935, 80 y.o.   MRN: [id number]  Congestive Heart Failure Presents for initial visit. The disease course has been improving. Associated symptoms include edema, fatigue (worse going up incline) and shortness of breath. Pertinent negatives include no abdominal pain, chest pain, chest pressure, orthopnea, palpitations or paroxysmal nocturnal dyspnea. The symptoms have been improving. Past treatments include salt and fluid restriction, beta blockers, angiotensin receptor blockers and aldosterone receptor blockers. The treatment provided moderate relief. Compliance with prior treatments has been good. His past medical history is significant for arrhythmia, CAD, chronic lung disease, CVA, DM and HTN. There is no history of DVT. He has one 1st degree relative with heart disease.  Atrial Fibrillation Presents for initial visit. Symptoms include bradycardia, hypotension and shortness of breath. Symptoms are negative for chest pain, dizziness, palpitations and weakness. The symptoms have been stable. Past treatments include beta blockers, anticoagulant and aspirin. Compliance with prior treatments has been good. Past medical history includes atrial fibrillation, CAD, CHF, HTN and hyperlipidemia. There is no history of DVT. There are no medication compliance problems.   Past Medical History  Diagnosis Date  . CAD (coronary artery disease)     s/p CABG  . Cerebral vascular accident [facility](HCC)     with residual right sided weakness  . Restrictive lung disease     Not on home o2  . A-fib [facility](HCC)     s/p ablation  . COPD (chronic obstructive pulmonary disease) (HCC)   . Chronic kidney disease     stage 3, GFR 31 at baseline  . Diabetes mellitus without complication (HCC)   . Type 2 diabetes mellitus with stage 3 chronic kidney disease (HCC)   . Cardiomyopathy (HCC)     Last EF 15% in Nov 2016  . Hyperlipidemia   . Hypertension   . Diabetic  retinopathy (HCC)   . BPH (benign prostatic hyperplasia)     Past Surgical History  Procedure Laterality Date  . Coronary artery bypass graft    . Pacemaker insertion    . Vasectomy    . Appendectomy    . Cardiac defibrillator placement      Family History  Problem Relation Age of Onset  . Diabetes Mother   . Other Father     Ruptured AAA  . CAD Mother     Social History  Substance Use Topics  . Smoking status: Former Smoker    Types: Cigarettes, Cigars, Pipe    Quit date: 09/27/1968  . Smokeless tobacco: Former NeurosurgeonUser    Types: Chew    Quit date: 10/01/1979  . Alcohol Use: No    Allergies  Allergen Reactions  . Heparin Other (See Comments)    Reaction:  Unknown   . Lipitor [Atorvastatin] Other (See Comments)    Reaction:  Memory issues   . Toujeo Solostar [Insulin Glargine] Other (See Comments)    Reaction:  Weight gain     Prior to Admission medications   Medication Sig Start Date End Date Taking? Authorizing Provider  apixaban (ELIQUIS) 2.5 MG TABS tablet Take 2.5 mg by mouth 2 (two) times daily.    Yes Historical Provider, MD  aspirin EC 81 MG tablet Take 81 mg by mouth daily.    Yes Historical Provider, MD  B Complex-C (B-COMPLEX WITH VITAMIN C) tablet Take 1 tablet by mouth daily.   Yes Historical Provider, MD  carvedilol (COREG) 3.125 MG tablet Take 3.125 mg  by mouth 2 (two) times daily with a meal.   Yes Historical Provider, MD  cholecalciferol (VITAMIN D) 1000 units tablet Take 1,000 Units by mouth daily.   Yes Historical Provider, MD  colesevelam (WELCHOL) 625 MG tablet Take 3 tablets (1,875 mg total) by mouth 2 (two) times daily. 11/20/14  Yes Steele Sizer, MD  ezetimibe (ZETIA) 10 MG tablet Take 1 tablet (10 mg total) by mouth daily. 11/20/14  Yes Steele Sizer, MD  ferrous sulfate 325 (65 FE) MG tablet Take 325 mg by mouth daily with breakfast.    Yes Historical Provider, MD  finasteride (PROSCAR) 5 MG tablet Take 1 tablet (5 mg total) by mouth at  bedtime. 11/20/14  Yes Steele Sizer, MD  Magnesium 250 MG TABS Take 250 mg by mouth daily.    Yes Historical Provider, MD  NOVOLOG FLEXPEN 100 UNIT/ML FlexPen inject 15 units subcutaneously three times a day with meals 06/24/15  Yes Historical Provider, MD  omega-3 fish oil (MAXEPA) 1000 MG CAPS capsule Take 2 capsules by mouth 2 (two) times daily.   Yes Historical Provider, MD  pantoprazole (PROTONIX) 40 MG tablet Take 40 mg by mouth daily.   Yes Historical Provider, MD  pravastatin (PRAVACHOL) 80 MG tablet Take 1 tablet (80 mg total) by mouth at bedtime. 11/20/14  Yes Steele Sizer, MD  pyridOXINE (VITAMIN B-6) 50 MG tablet Take 50 mg by mouth daily.   Yes Historical Provider, MD  saw palmetto 160 MG capsule Take 160 mg by mouth 2 (two) times daily.   Yes Historical Provider, MD  spironolactone (ALDACTONE) 25 MG tablet Take 12.5 mg by mouth daily.    Yes Historical Provider, MD  tamsulosin (FLOMAX) 0.4 MG CAPS capsule Take 1 capsule (0.4 mg total) by mouth 2 (two) times daily. 11/20/14  Yes Steele Sizer, MD  telmisartan (MICARDIS) 80 MG tablet Take 40 mg by mouth daily.   Yes Historical Provider, MD  tiotropium (SPIRIVA) 18 MCG inhalation capsule Place 18 mcg into inhaler and inhale daily.   Yes Historical Provider, MD  torsemide (DEMADEX) 10 MG tablet Take 3 tablets (30 mg total) by mouth daily. Patient taking differently: Take 40 mg by mouth daily.  07/30/15  Yes Wyatt Haste, MD  vitamin B-12 (CYANOCOBALAMIN) 100 MCG tablet Take 100 mcg by mouth daily.    Yes Historical Provider, MD  vitamin E 400 UNIT capsule Take 400 Units by mouth daily.    Yes Historical Provider, MD      Review of Systems  Constitutional: Positive for fatigue (worse going up incline). Negative for appetite change (decreased appetite).  HENT: Negative for congestion, postnasal drip and sore throat.   Eyes: Negative.   Respiratory: Positive for cough and shortness of breath. Negative for chest tightness and  wheezing.   Cardiovascular: Positive for leg swelling (better). Negative for chest pain and palpitations.  Gastrointestinal: Positive for abdominal distention. Negative for abdominal pain.  Endocrine: Negative.   Genitourinary: Negative.   Musculoskeletal: Negative for back pain and neck pain.  Skin: Negative.   Allergic/Immunologic: Negative.   Neurological: Negative for dizziness, weakness, light-headedness and headaches.  Hematological: Negative for adenopathy. Bruises/bleeds easily.  Psychiatric/Behavioral: Positive for dysphoric mood (little bit). Negative for sleep disturbance (sleeping on 1 pillow). The patient is not nervous/anxious.        Objective:   Physical Exam  Constitutional: He is oriented to person, place, and time. He appears well-developed and well-nourished.  HENT:  Head: Normocephalic  and atraumatic.  Eyes: Conjunctivae are normal. Pupils are equal, round, and reactive to light.  Neck: Normal range of motion. Neck supple.  Cardiovascular: An irregular rhythm present. Bradycardia present.   Pulmonary/Chest: Effort normal. He has no wheezes. He has no rales.  Abdominal: Soft. He exhibits distension. There is no tenderness.  Musculoskeletal: He exhibits edema (trace pitting edema in bilateral lower legs). He exhibits no tenderness.  Neurological: He is alert and oriented to person, place, and time.  Skin: Skin is warm and dry.  Psychiatric: He has a normal mood and affect. His behavior is normal. Thought content normal.  Nursing note and vitals reviewed.   BP 75/46 mmHg  Pulse 59  Resp 18  Ht 5\' 11"  (1.803 m)  Wt 200 lb (90.719 kg)  BMI 27.91 kg/m2  SpO2 100%       Assessment & Plan:  1: Chronic heart failure with reduced ejection fraction- Patient presents with fatigue and shortness of breath upon exertion. He says that he had to rest in the lobby before continuing because walking up an incline makes his breathing worse. Once he sits down for a few  minutes, his symptoms improve. He had some edema in his lower legs but he says that they look better. He is wearing TED hose during the day with removal of them at bedtime. He is already weighing himself daily and says that his weight has been stable. Instructed to call for an overnight weight gain of >2 pounds or a weekly weight gain of >5 pounds. He is not adding any salt to his food and his wife doesn't cook with salt either. His wife does read food labels. Discussed the importance of following a 2000mg  sodium diet and written information was given to them about this. Currently on micardis.  2: Hypotension- Blood pressure is on the low side and patient's granddaughter says that it's been running low. He denies any dizziness with any position changes. Titration of medications will be difficult given his blood pressure. 3: Atrial fibrillation- Currently bradycardic and is taking eliquis and carvedilol. Follows closely with cardiology.   Medication list was verbally reviewed with the patient and granddaughter.  Return in 1 month or sooner for any questions/problems before then.

## 2015-08-08 NOTE — Patient Instructions (Signed)
Continue weighing daily and call for an overnight weight gain of > 2 pounds or a weekly weight gain of >5 pounds. 

## 2015-08-09 ENCOUNTER — Encounter: Payer: Self-pay | Admitting: *Deleted

## 2015-08-09 ENCOUNTER — Other Ambulatory Visit: Payer: Self-pay | Admitting: *Deleted

## 2015-08-09 DIAGNOSIS — I509 Heart failure, unspecified: Secondary | ICD-10-CM | POA: Insufficient documentation

## 2015-08-09 DIAGNOSIS — I959 Hypotension, unspecified: Secondary | ICD-10-CM | POA: Insufficient documentation

## 2015-08-09 NOTE — Patient Outreach (Signed)
After 2 previous attempts, today transition of care call successful.   Spoke with pt, HIPPA verified.   Pt reports since discharge,  think can do better, sob is always there, no increase.  Pt reports weighing daily, recording, weight started going back up, today 195 lbs, need to get in touch with Dr. Cherylann RatelLateef (kidney MD).  Pt could not recall previous weights and did not want to get recorded weights.  Pt reports he does have swelling in legs, has a little more now.   RN CM discussed with pt recent visit at HF clinic- BP low plus was instructed to call for weight gain of > 2 lbs in a day, 5 lbs in a week.  Pt reports his BP tends to run low, no c/o dizziness and did say would call HF clinic if have weight gain.  Attempt made to review discharge medications to which pt reports he is getting ready to go, spouse assists with medications/fills pill planner.   RN CM requested to speak with [facility]) about his medications - busy now, agreed to have RN CM call back later today.  RN CM also discussed doing a home visit as part of transition of care to which pt states no home visits.     Plan to f/u with spouse later today, discuss pt's discharge medications.  Plan to inform Dr. Dossie [facility]issman [facility]- send barrier letter.   Shayne Alkenose M.   Pierzchala RN CCM [facility] Care [facility]  [phone]

## 2015-08-12 ENCOUNTER — Inpatient Hospital Stay
Admission: AD | Admit: 2015-08-12 | Discharge: 2015-09-05 | DRG: 291 | Disposition: E | Payer: PPO | Source: Ambulatory Visit | Attending: Internal Medicine | Admitting: Internal Medicine

## 2015-08-12 ENCOUNTER — Inpatient Hospital Stay: Payer: PPO

## 2015-08-12 DIAGNOSIS — R188 Other ascites: Secondary | ICD-10-CM | POA: Diagnosis present

## 2015-08-12 DIAGNOSIS — Z515 Encounter for palliative care: Secondary | ICD-10-CM | POA: Diagnosis present

## 2015-08-12 DIAGNOSIS — E1122 Type 2 diabetes mellitus with diabetic chronic kidney disease: Secondary | ICD-10-CM | POA: Diagnosis present

## 2015-08-12 DIAGNOSIS — I959 Hypotension, unspecified: Secondary | ICD-10-CM | POA: Diagnosis not present

## 2015-08-12 DIAGNOSIS — I5023 Acute on chronic systolic (congestive) heart failure: Secondary | ICD-10-CM | POA: Diagnosis present

## 2015-08-12 DIAGNOSIS — I471 Supraventricular tachycardia: Secondary | ICD-10-CM | POA: Diagnosis present

## 2015-08-12 DIAGNOSIS — Z95810 Presence of automatic (implantable) cardiac defibrillator: Secondary | ICD-10-CM

## 2015-08-12 DIAGNOSIS — E8809 Other disorders of plasma-protein metabolism, not elsewhere classified: Secondary | ICD-10-CM | POA: Diagnosis present

## 2015-08-12 DIAGNOSIS — I69351 Hemiplegia and hemiparesis following cerebral infarction affecting right dominant side: Secondary | ICD-10-CM | POA: Diagnosis not present

## 2015-08-12 DIAGNOSIS — E871 Hypo-osmolality and hyponatremia: Secondary | ICD-10-CM | POA: Diagnosis present

## 2015-08-12 DIAGNOSIS — I130 Hypertensive heart and chronic kidney disease with heart failure and stage 1 through stage 4 chronic kidney disease, or unspecified chronic kidney disease: Secondary | ICD-10-CM | POA: Diagnosis present

## 2015-08-12 DIAGNOSIS — R570 Cardiogenic shock: Secondary | ICD-10-CM | POA: Diagnosis not present

## 2015-08-12 DIAGNOSIS — N2581 Secondary hyperparathyroidism of renal origin: Secondary | ICD-10-CM | POA: Diagnosis present

## 2015-08-12 DIAGNOSIS — Z888 Allergy status to other drugs, medicaments and biological substances status: Secondary | ICD-10-CM

## 2015-08-12 DIAGNOSIS — E11319 Type 2 diabetes mellitus with unspecified diabetic retinopathy without macular edema: Secondary | ICD-10-CM | POA: Diagnosis present

## 2015-08-12 DIAGNOSIS — I4892 Unspecified atrial flutter: Secondary | ICD-10-CM | POA: Diagnosis present

## 2015-08-12 DIAGNOSIS — E872 Acidosis: Secondary | ICD-10-CM | POA: Diagnosis present

## 2015-08-12 DIAGNOSIS — Z7982 Long term (current) use of aspirin: Secondary | ICD-10-CM

## 2015-08-12 DIAGNOSIS — Z951 Presence of aortocoronary bypass graft: Secondary | ICD-10-CM

## 2015-08-12 DIAGNOSIS — I255 Ischemic cardiomyopathy: Secondary | ICD-10-CM | POA: Diagnosis present

## 2015-08-12 DIAGNOSIS — N400 Enlarged prostate without lower urinary tract symptoms: Secondary | ICD-10-CM | POA: Diagnosis present

## 2015-08-12 DIAGNOSIS — Z87891 Personal history of nicotine dependence: Secondary | ICD-10-CM

## 2015-08-12 DIAGNOSIS — E875 Hyperkalemia: Secondary | ICD-10-CM | POA: Diagnosis present

## 2015-08-12 DIAGNOSIS — Z7901 Long term (current) use of anticoagulants: Secondary | ICD-10-CM | POA: Diagnosis not present

## 2015-08-12 DIAGNOSIS — K219 Gastro-esophageal reflux disease without esophagitis: Secondary | ICD-10-CM | POA: Diagnosis present

## 2015-08-12 DIAGNOSIS — N184 Chronic kidney disease, stage 4 (severe): Secondary | ICD-10-CM | POA: Diagnosis present

## 2015-08-12 DIAGNOSIS — Z794 Long term (current) use of insulin: Secondary | ICD-10-CM | POA: Diagnosis not present

## 2015-08-12 DIAGNOSIS — Z86718 Personal history of other venous thrombosis and embolism: Secondary | ICD-10-CM

## 2015-08-12 DIAGNOSIS — I480 Paroxysmal atrial fibrillation: Secondary | ICD-10-CM | POA: Diagnosis present

## 2015-08-12 DIAGNOSIS — Z8249 Family history of ischemic heart disease and other diseases of the circulatory system: Secondary | ICD-10-CM | POA: Diagnosis not present

## 2015-08-12 DIAGNOSIS — E785 Hyperlipidemia, unspecified: Secondary | ICD-10-CM | POA: Diagnosis present

## 2015-08-12 DIAGNOSIS — Z66 Do not resuscitate: Secondary | ICD-10-CM | POA: Diagnosis present

## 2015-08-12 DIAGNOSIS — I4891 Unspecified atrial fibrillation: Secondary | ICD-10-CM | POA: Diagnosis not present

## 2015-08-12 DIAGNOSIS — I2510 Atherosclerotic heart disease of native coronary artery without angina pectoris: Secondary | ICD-10-CM | POA: Diagnosis present

## 2015-08-12 DIAGNOSIS — N179 Acute kidney failure, unspecified: Secondary | ICD-10-CM | POA: Diagnosis present

## 2015-08-12 DIAGNOSIS — Z833 Family history of diabetes mellitus: Secondary | ICD-10-CM | POA: Diagnosis not present

## 2015-08-12 DIAGNOSIS — I129 Hypertensive chronic kidney disease with stage 1 through stage 4 chronic kidney disease, or unspecified chronic kidney disease: Secondary | ICD-10-CM | POA: Diagnosis not present

## 2015-08-12 DIAGNOSIS — I509 Heart failure, unspecified: Secondary | ICD-10-CM

## 2015-08-12 DIAGNOSIS — D631 Anemia in chronic kidney disease: Secondary | ICD-10-CM | POA: Diagnosis present

## 2015-08-12 DIAGNOSIS — R0600 Dyspnea, unspecified: Secondary | ICD-10-CM

## 2015-08-12 DIAGNOSIS — J449 Chronic obstructive pulmonary disease, unspecified: Secondary | ICD-10-CM | POA: Diagnosis present

## 2015-08-12 DIAGNOSIS — I5021 Acute systolic (congestive) heart failure: Secondary | ICD-10-CM | POA: Diagnosis not present

## 2015-08-12 LAB — COMPREHENSIVE METABOLIC PANEL
ALT: 26 U/L (ref 17–63)
ANION GAP: 20 — AB (ref 5–15)
AST: 27 U/L (ref 15–41)
Albumin: 3.1 g/dL — ABNORMAL LOW (ref 3.5–5.0)
Alkaline Phosphatase: 111 U/L (ref 38–126)
BUN: 163 mg/dL — AB (ref 6–20)
CHLORIDE: 83 mmol/L — AB (ref 101–111)
CO2: 14 mmol/L — ABNORMAL LOW (ref 22–32)
Calcium: 8.6 mg/dL — ABNORMAL LOW (ref 8.9–10.3)
Creatinine, Ser: 9.28 mg/dL — ABNORMAL HIGH (ref 0.61–1.24)
GFR, EST AFRICAN AMERICAN: 5 mL/min — AB (ref >60)
GFR, EST NON AFRICAN AMERICAN: 5 mL/min — AB (ref >60)
Glucose, Bld: 230 mg/dL — ABNORMAL HIGH (ref 65–99)
POTASSIUM: 7.2 mmol/L — AB (ref 3.5–5.1)
Sodium: 117 mmol/L — CL (ref 135–145)
Total Bilirubin: 2.5 mg/dL — ABNORMAL HIGH (ref 0.3–1.2)
Total Protein: 6.4 g/dL — ABNORMAL LOW (ref 6.5–8.1)

## 2015-08-12 LAB — URINALYSIS COMPLETE WITH MICROSCOPIC (ARMC ONLY)
BILIRUBIN URINE: NEGATIVE
Glucose, UA: NEGATIVE mg/dL
HGB URINE DIPSTICK: NEGATIVE
KETONES UR: NEGATIVE mg/dL
LEUKOCYTES UA: NEGATIVE
NITRITE: NEGATIVE
PH: 5 (ref 5.0–8.0)
Protein, ur: 100 mg/dL — AB
Specific Gravity, Urine: 1.013 (ref 1.005–1.030)
Squamous Epithelial / LPF: NONE SEEN

## 2015-08-12 LAB — RENAL FUNCTION PANEL
ALBUMIN: 3 g/dL — AB (ref 3.5–5.0)
Anion gap: 20 — ABNORMAL HIGH (ref 5–15)
BUN: 162 mg/dL — AB (ref 6–20)
CALCIUM: 9.4 mg/dL (ref 8.9–10.3)
CO2: 16 mmol/L — ABNORMAL LOW (ref 22–32)
Chloride: 85 mmol/L — ABNORMAL LOW (ref 101–111)
Creatinine, Ser: 9.13 mg/dL — ABNORMAL HIGH (ref 0.61–1.24)
GFR calc Af Amer: 6 mL/min — ABNORMAL LOW (ref >60)
GFR calc non Af Amer: 5 mL/min — ABNORMAL LOW (ref >60)
GLUCOSE: 104 mg/dL — AB (ref 65–99)
PHOSPHORUS: 10.8 mg/dL — AB (ref 2.5–4.6)
Potassium: 6.9 mmol/L (ref 3.5–5.1)
SODIUM: 121 mmol/L — AB (ref 135–145)

## 2015-08-12 LAB — CBC
HCT: 34.6 % — ABNORMAL LOW (ref 40.0–52.0)
Hemoglobin: 11.6 g/dL — ABNORMAL LOW (ref 13.0–18.0)
MCH: 32.2 pg (ref 26.0–34.0)
MCHC: 33.4 g/dL (ref 32.0–36.0)
MCV: 96.3 fL (ref 80.0–100.0)
PLATELETS: 108 10*3/uL — AB (ref 150–440)
RBC: 3.6 MIL/uL — ABNORMAL LOW (ref 4.40–5.90)
RDW: 15.9 % — AB (ref 11.5–14.5)
WBC: 6.2 10*3/uL (ref 3.8–10.6)

## 2015-08-12 LAB — GLUCOSE, CAPILLARY
GLUCOSE-CAPILLARY: 198 mg/dL — AB (ref 65–99)
Glucose-Capillary: 228 mg/dL — ABNORMAL HIGH (ref 65–99)

## 2015-08-12 MED ORDER — VITAMIN B-12 100 MCG PO TABS
100.0000 ug | ORAL_TABLET | Freq: Every day | ORAL | Status: DC
  Administered 2015-08-13 – 2015-08-14 (×2): 100 ug via ORAL
  Filled 2015-08-12 (×3): qty 1

## 2015-08-12 MED ORDER — ASPIRIN EC 81 MG PO TBEC
81.0000 mg | DELAYED_RELEASE_TABLET | Freq: Every day | ORAL | Status: DC
  Administered 2015-08-13 – 2015-08-14 (×2): 81 mg via ORAL
  Filled 2015-08-12 (×2): qty 1

## 2015-08-12 MED ORDER — IRBESARTAN 150 MG PO TABS: 150.0000 mg | ORAL_TABLET | Freq: Every day | ORAL | Status: DC

## 2015-08-12 MED ORDER — SPIRONOLACTONE 25 MG PO TABS: 12.5000 mg | ORAL_TABLET | Freq: Every day | ORAL | Status: DC

## 2015-08-12 MED ORDER — DEXTROSE 50 % IV SOLN
25.0000 mL | Freq: Once | INTRAVENOUS | Status: AC
  Administered 2015-08-12: 25 mL via INTRAVENOUS
  Filled 2015-08-12: qty 50

## 2015-08-12 MED ORDER — APIXABAN 2.5 MG PO TABS: 2.5000 mg | ORAL_TABLET | Freq: Two times a day (BID) | ORAL | Status: DC

## 2015-08-12 MED ORDER — COLESEVELAM HCL 625 MG PO TABS
1875.0000 mg | ORAL_TABLET | Freq: Two times a day (BID) | ORAL | Status: DC
Start: 2015-08-12 — End: 2015-08-12
  Filled 2015-08-12 (×2): qty 3

## 2015-08-12 MED ORDER — INSULIN ASPART 100 UNIT/ML ~~LOC~~ SOLN
10.0000 [IU] | Freq: Three times a day (TID) | SUBCUTANEOUS | Status: DC
  Administered 2015-08-12: 10 [IU] via SUBCUTANEOUS
  Filled 2015-08-12: qty 10

## 2015-08-12 MED ORDER — TIOTROPIUM BROMIDE MONOHYDRATE 18 MCG IN CAPS
18.0000 ug | ORAL_CAPSULE | Freq: Every day | RESPIRATORY_TRACT | Status: DC
  Administered 2015-08-13 – 2015-08-14 (×2): 18 ug via RESPIRATORY_TRACT
  Filled 2015-08-12 (×2): qty 5

## 2015-08-12 MED ORDER — MAGNESIUM OXIDE 400 (241.3 MG) MG PO TABS
400.0000 mg | ORAL_TABLET | Freq: Every day | ORAL | Status: DC
  Administered 2015-08-13 – 2015-08-14 (×2): 400 mg via ORAL
  Filled 2015-08-12 (×2): qty 1

## 2015-08-12 MED ORDER — EZETIMIBE 10 MG PO TABS
10.0000 mg | ORAL_TABLET | Freq: Every day | ORAL | Status: DC
  Administered 2015-08-12 – 2015-08-14 (×3): 10 mg via ORAL
  Filled 2015-08-12 (×3): qty 1

## 2015-08-12 MED ORDER — VITAMIN E 180 MG (400 UNIT) PO CAPS
400.0000 [IU] | ORAL_CAPSULE | Freq: Every day | ORAL | Status: DC
  Administered 2015-08-14: 400 [IU] via ORAL
  Filled 2015-08-12 (×2): qty 1

## 2015-08-12 MED ORDER — VITAMIN D 1000 UNITS PO TABS
1000.0000 [IU] | ORAL_TABLET | Freq: Every day | ORAL | Status: DC
  Administered 2015-08-13 – 2015-08-14 (×2): 1000 [IU] via ORAL
  Filled 2015-08-12 (×2): qty 1

## 2015-08-12 MED ORDER — FERROUS SULFATE 325 (65 FE) MG PO TABS
325.0000 mg | ORAL_TABLET | Freq: Every day | ORAL | Status: DC
  Administered 2015-08-13 – 2015-08-14 (×2): 325 mg via ORAL
  Filled 2015-08-12 (×2): qty 1

## 2015-08-12 MED ORDER — ONDANSETRON HCL 4 MG/2ML IJ SOLN: 4.0000 mg | Freq: Four times a day (QID) | INTRAMUSCULAR | Status: DC | PRN

## 2015-08-12 MED ORDER — DOCUSATE SODIUM 100 MG PO CAPS
100.0000 mg | ORAL_CAPSULE | Freq: Two times a day (BID) | ORAL | Status: DC
  Administered 2015-08-12 – 2015-08-14 (×4): 100 mg via ORAL
  Filled 2015-08-12 (×4): qty 1

## 2015-08-12 MED ORDER — PRAVASTATIN SODIUM 20 MG PO TABS
80.0000 mg | ORAL_TABLET | Freq: Every day | ORAL | Status: DC
  Administered 2015-08-12 – 2015-08-13 (×2): 80 mg via ORAL
  Filled 2015-08-12 (×2): qty 4

## 2015-08-12 MED ORDER — FUROSEMIDE 10 MG/ML IJ SOLN
8.0000 mg/h | INTRAVENOUS | Status: DC
  Administered 2015-08-12: 8 mg/h via INTRAVENOUS
  Filled 2015-08-12: qty 25

## 2015-08-12 MED ORDER — ADULT MULTIVITAMIN W/MINERALS CH
1.0000 | ORAL_TABLET | Freq: Every day | ORAL | Status: DC
  Administered 2015-08-13 – 2015-08-14 (×2): 1 via ORAL
  Filled 2015-08-12 (×2): qty 1

## 2015-08-12 MED ORDER — OMEGA-3-ACID ETHYL ESTERS 1 G PO CAPS
2.0000 | ORAL_CAPSULE | Freq: Two times a day (BID) | ORAL | Status: DC
  Administered 2015-08-12 – 2015-08-14 (×4): 2 g via ORAL
  Filled 2015-08-12 (×4): qty 2

## 2015-08-12 MED ORDER — SODIUM POLYSTYRENE SULFONATE 15 GM/60ML PO SUSP
30.0000 g | Freq: Once | ORAL | Status: AC
  Administered 2015-08-12: 30 g via ORAL
  Filled 2015-08-12: qty 120

## 2015-08-12 MED ORDER — ONDANSETRON HCL 4 MG PO TABS: 4.0000 mg | ORAL_TABLET | Freq: Four times a day (QID) | ORAL | Status: DC | PRN

## 2015-08-12 MED ORDER — ACETAMINOPHEN 325 MG PO TABS: 650.0000 mg | ORAL_TABLET | Freq: Four times a day (QID) | ORAL | Status: DC | PRN

## 2015-08-12 MED ORDER — ACETAMINOPHEN 650 MG RE SUPP
650.0000 mg | Freq: Four times a day (QID) | RECTAL | Status: DC | PRN
Start: 2015-08-12 — End: 2015-08-14

## 2015-08-12 MED ORDER — VITAMIN B-6 50 MG PO TABS
50.0000 mg | ORAL_TABLET | Freq: Every day | ORAL | Status: DC
  Administered 2015-08-13 – 2015-08-14 (×2): 50 mg via ORAL
  Filled 2015-08-12 (×3): qty 1

## 2015-08-12 MED ORDER — SODIUM CHLORIDE 0.9% FLUSH
3.0000 mL | Freq: Two times a day (BID) | INTRAVENOUS | Status: DC
  Administered 2015-08-12 – 2015-08-15 (×6): 3 mL via INTRAVENOUS

## 2015-08-12 MED ORDER — CALCIUM GLUCONATE 10 % IV SOLN
2.0000 g | INTRAVENOUS | Status: AC
  Administered 2015-08-12: 2 g via INTRAVENOUS
  Filled 2015-08-12: qty 20

## 2015-08-12 MED ORDER — ANTICOAGULANT SODIUM CITRATE 4% (200MG/5ML) IV SOLN
5.0000 mL | Status: DC | PRN
Start: 2015-08-12 — End: 2015-08-13
  Administered 2015-08-12: 5 mL via INTRAVENOUS
  Filled 2015-08-12 (×5): qty 250

## 2015-08-12 MED ORDER — TAMSULOSIN HCL 0.4 MG PO CAPS
0.4000 mg | ORAL_CAPSULE | Freq: Two times a day (BID) | ORAL | Status: DC
  Administered 2015-08-12 – 2015-08-14 (×4): 0.4 mg via ORAL
  Filled 2015-08-12 (×4): qty 1

## 2015-08-12 MED ORDER — PANTOPRAZOLE SODIUM 40 MG PO TBEC
40.0000 mg | DELAYED_RELEASE_TABLET | Freq: Every day | ORAL | Status: DC
  Administered 2015-08-13 – 2015-08-14 (×2): 40 mg via ORAL
  Filled 2015-08-12 (×2): qty 1

## 2015-08-12 MED ORDER — CARVEDILOL 3.125 MG PO TABS: 3.1250 mg | ORAL_TABLET | Freq: Two times a day (BID) | ORAL | Status: DC

## 2015-08-12 MED ORDER — INSULIN ASPART 100 UNIT/ML IV SOLN
10.0000 [IU] | Freq: Once | INTRAVENOUS | Status: AC
  Administered 2015-08-12: 10 [IU] via INTRAVENOUS
  Filled 2015-08-12 (×2): qty 0.1

## 2015-08-12 MED ORDER — DOBUTAMINE IN D5W 4-5 MG/ML-% IV SOLN
2.5000 ug/kg/min | INTRAVENOUS | Status: DC
  Administered 2015-08-12: 2.5 ug/kg/min via INTRAVENOUS
  Filled 2015-08-12 (×2): qty 250

## 2015-08-12 MED ORDER — FINASTERIDE 5 MG PO TABS
5.0000 mg | ORAL_TABLET | Freq: Every day | ORAL | Status: DC
  Administered 2015-08-12 – 2015-08-13 (×2): 5 mg via ORAL
  Filled 2015-08-12 (×2): qty 1

## 2015-08-12 MED ORDER — POLYETHYLENE GLYCOL 3350 17 G PO PACK: 17.0000 g | PACK | Freq: Every day | ORAL | Status: DC | PRN

## 2015-08-12 NOTE — Progress Notes (Addendum)
error 

## 2015-08-12 NOTE — Progress Notes (Addendum)
Patients labs are back now- sodium 117, potassium 7.2, creatinine 9.28 with GFR of 5. Significantly worsened from 10 days ago. -Discussed with nephrologist Dr. Cherylann RatelLateef Patient will likely need emergency dialysis. Updated family. Was discussed with on-call nephrologist and also on-call vascular team. - kayexalate, insulin/dextrose and calcium gluconate ordered  Transfer patient to setpdown  Hold eliquis, start SQ heparin for DVT prophylaxis Dobutamine drip as BP low

## 2015-08-12 NOTE — Consult Note (Signed)
[facility] Vascular Consult Note  MRN : [id number]  Matthew [name] is a 80 y.o. (09-03-1935) male who presents with chief complaint of No chief complaint on file. Marland Kitchen  History of Present Illness: Patient is admitted to the hospital with shortness of breath and swelling and has developed acute renal failure on top of chronic kidney disease.  The patient reports lethargy and poor appetite. He has no fevers or chills..  The patient is critically ill with a potassium of over 7 and congestive heart failure. The nephrology service has decided to initiate dialysis at this time, and we are asked to place a temporary dialysis catheter for immediate dialysis use.    Current Facility-Administered Medications  Medication Dose Route Frequency Provider Last Rate Last Dose  . acetaminophen (TYLENOL) tablet 650 mg  650 mg Oral Q6H PRN Enid Baas, MD       Or  . acetaminophen (TYLENOL) suppository 650 mg  650 mg Rectal Q6H PRN Enid Baas, MD      . aspirin EC tablet 81 mg  81 mg Oral Daily Enid Baas, MD   0 mg at 08-29-15 1415  . calcium gluconate 2 g in sodium chloride 0.9 % 100 mL IVPB  2 g Intravenous STAT Enid Baas, MD   2 g at 08-29-15 1713  . cholecalciferol (VITAMIN D) tablet 1,000 Units  1,000 Units Oral Daily Enid Baas, MD   0 Units at 2015-08-29 1415  . DOBUTamine (DOBUTREX) infusion 4000 mcg/mL  2.5-20 mcg/kg/min Intravenous Titrated Enid Baas, MD      . docusate sodium (COLACE) capsule 100 mg  100 mg Oral BID Enid Baas, MD   0 mg at 08-29-2015 1415  . ezetimibe (ZETIA) tablet 10 mg  10 mg Oral Daily Enid Baas, MD      . Melene Muller ON 08/13/2015] ferrous sulfate tablet 325 mg  325 mg Oral Q breakfast Enid Baas, MD      . finasteride (PROSCAR) tablet 5 mg  5 mg Oral QHS Enid Baas, MD      . insulin aspart (novoLOG) injection 10 Units  10 Units Subcutaneous TID WC Enid Baas, MD      . magnesium  oxide (MAG-OX) tablet 400 mg  400 mg Oral Daily Enid Baas, MD   0 mg at 08/29/15 1415  . multivitamin with minerals tablet 1 tablet  1 tablet Oral Daily Enid Baas, MD   0 tablet at 08/29/2015 1415  . omega-3 acid ethyl esters (LOVAZA) capsule 2 g  2 capsule Oral BID Enid Baas, MD      . ondansetron (ZOFRAN) tablet 4 mg  4 mg Oral Q6H PRN Enid Baas, MD       Or  . ondansetron (ZOFRAN) injection 4 mg  4 mg Intravenous Q6H PRN Enid Baas, MD      . pantoprazole (PROTONIX) EC tablet 40 mg  40 mg Oral Daily Enid Baas, MD   0 mg at 2015-08-29 1415  . polyethylene glycol (MIRALAX / GLYCOLAX) packet 17 g  17 g Oral Daily PRN Enid Baas, MD      . pravastatin (PRAVACHOL) tablet 80 mg  80 mg Oral QHS Enid Baas, MD      . pyridOXINE (VITAMIN B-6) tablet 50 mg  50 mg Oral Daily Enid Baas, MD   0 mg at 29-Aug-2015 1415  . sodium chloride flush (NS) 0.9 % injection 3 mL  3 mL Intravenous Q12H Enid Baas, MD   3 mL at 08-29-2015  1415  . tamsulosin (FLOMAX) capsule 0.4 mg  0.4 mg Oral BID Enid Baasadhika Kalisetti, MD      . tiotropium [facility](SPIRIVA) inhalation capsule 18 mcg  18 mcg Inhalation Daily Enid Baasadhika Kalisetti, MD   0 mcg at 07-Jun-2015 1415  . vitamin B-12 (CYANOCOBALAMIN) tablet 100 mcg  100 mcg Oral Daily Enid Baasadhika Kalisetti, MD   0 mcg at 07-Jun-2015 1415  . vitamin E capsule 400 Units  400 Units Oral Daily Enid Baasadhika Kalisetti, MD   0 Units at 07-Jun-2015 1415    Past Medical History  Diagnosis Date  . CAD (coronary artery disease)     s/p CABG  . Cerebral vascular accident [facility](HCC)     with residual right sided weakness  . Restrictive lung disease     Not on home o2  . A-fib [facility](HCC)     s/p ablation  . COPD (chronic obstructive pulmonary disease) (HCC)   . Chronic kidney disease     stage 3, GFR 31 at baseline  . Diabetes mellitus without complication (HCC)   . Type 2 diabetes mellitus with stage 3 chronic kidney disease (HCC)   . Cardiomyopathy (HCC)      Last EF 15% in Nov 2016  . Hyperlipidemia   . Hypertension   . Diabetic retinopathy (HCC)   . BPH (benign prostatic hyperplasia)     Past Surgical History  Procedure Laterality Date  . Coronary artery bypass graft    . Pacemaker insertion    . Vasectomy    . Appendectomy    . Cardiac defibrillator placement      Social History Social History  Substance Use Topics  . Smoking status: Former Smoker    Types: Cigarettes, Cigars, Pipe    Quit date: 09/27/1968  . Smokeless tobacco: Former NeurosurgeonUser    Types: Chew    Quit date: 10/01/1979  . Alcohol Use: No  No IV drug use  Family History Family History  Problem Relation Age of Onset  . Diabetes Mother   . Other Father     Ruptured AAA  . CAD Mother   No bleeding or clotting disorders  Allergies  Allergen Reactions  . Heparin Other (See Comments)    Reaction:  Unknown   . Lipitor [Atorvastatin] Other (See Comments)    Reaction:  Memory issues   . Toujeo Solostar [Insulin Glargine] Other (See Comments)    Reaction:  Weight gain      REVIEW OF SYSTEMS (Negative unless checked)  Constitutional: [] Weight loss  [] Fever  [] Chills Cardiac: [] Chest pain   [] Chest pressure   [] Palpitations   [x] Shortness of breath when laying flat   [] Shortness of breath at rest   [x] Shortness of breath with exertion. Vascular:  [] Pain in legs with walking   [] Pain in legs at rest   [] Pain in legs when laying flat   [] Claudication   [] Pain in feet when walking  [] Pain in feet at rest  [] Pain in feet when laying flat   [] History of DVT   [] Phlebitis   [x] Swelling in legs   [] Varicose veins   [] Non-healing ulcers Pulmonary:   [] Uses home oxygen   [] Productive cough   [] Hemoptysis   [] Wheeze  [] COPD   [] Asthma Neurologic:  [] Dizziness  [] Blackouts   [] Seizures   [] History of stroke   [] History of TIA  [] Aphasia   [] Temporary blindness   [] Dysphagia   [] Weakness or numbness in arms   [] Weakness or numbness in legs Musculoskeletal:  [] Arthritis    [] Joint swelling   []   Joint pain   [] Low back pain Hematologic:  [] Easy bruising  [] Easy bleeding   [] Hypercoagulable state   [] Anemic  [] Hepatitis Gastrointestinal:  [] Blood in stool   [] Vomiting blood  [] Gastroesophageal reflux/heartburn   [] Difficulty swallowing. Genitourinary:  [x] Chronic kidney disease   [] Difficult urination  [] Frequent urination  [] Burning with urination   [] Blood in urine Skin:  [] Rashes   [] Ulcers   [] Wounds Psychological:  [] History of anxiety   []  History of major depression.    Physical Examination  Filed Vitals:   09/01/15 1355 09/01/15 1400  BP: 85/57 90/55  Pulse: 61   Temp: 97.8 F (36.6 C)   TempSrc: Oral   Resp: 18   Height: 5\' 11"  (1.803 m)   Weight: 90.22 kg (198 lb 14.4 oz)   SpO2: 97%    Body mass index is 27.75 kg/(m^2). Gen: WD/WN Head: [place]/AT, No temporalis wasting Ear/Nose/Throat: Hearing grossly intact, nares w/o erythema or drainage,  Eyes: PERRLA, EOMI.  Neck: Supple, no nuchal rigidity.  No JVD.  Pulmonary:  Good air movement Cardiac: RRR, normal S1, S2  Gastrointestinal: soft, non-tender/non-distended. No guarding/reflex.  Musculoskeletal: M/S 5/5 throughout.  Extremities without ischemic changes.  No deformity or atrophy. Moderate Edema in the lower extremities bilaterally Neurologic: Moves all 4 extremities, cranial nerves intact. Psychiatric: Difficult to assess due to the severity of patient's illness. Dermatologic: No rashes or ulcers noted.   Lymph : No Cervical, Axillary, or Inguinal lymphadenopathy.    CBC Lab Results  Component Value Date   WBC 6.2 09/01/2015   HGB 11.6* 09-01-15   HCT 34.6* 09-01-2015   MCV 96.3 09/01/15   PLT 108* Sep 01, 2015    BMET    Component Value Date/Time   NA 117* 09/01/15 1412   NA 134 05/27/2015 1350   K 7.2* Sep 01, 2015 1412   CL 83* 09/01/15 1412   CO2 14* 09-01-2015 1412   GLUCOSE 230* 09/01/2015 1412   GLUCOSE 215* 05/27/2015 1350   BUN 163* 2015/09/01 1412   BUN  62* 05/27/2015 1350   CREATININE 9.28* Sep 01, 2015 1412   CALCIUM 8.6* 09/01/15 1412   GFRNONAA 5* 09-01-2015 1412   GFRAA 5* 2015-09-01 1412   Estimated Creatinine Clearance: 7 mL/min (by C-G formula based on Cr of 9.28).  COAG Lab Results  Component Value Date   INR 2.3 03/31/2013    Radiology Dg Chest 2 View  2015-09-01  CLINICAL DATA:  history of CAD s/p CABG, CVA with residual right sided weakness, COPD not on home o2, Atrial flutter s/p ablations and cardioversion on eliquis now, CKD stage 3, CHF with systolic dysfunction EF 15% sent in from nephrology office for worsening pedal edema and weight gain in the last 2-3 weeks.He was just in the hospital 2 weeks ago for the same reason and was discharged on increased dose of torsemide after Lasix drip. His discharge weight was 184 pounds. When he went to see his nephrologist today, weight increased up to 207 pounds again. Worsening ascites in the abdomen and also worsening shortness of breath. He does complain of worsening shortness of breath, increasing dyspnea even with short distances and orthopnea. His urine output has been steadily decreasing.So he is being sent to the hospital for Lasix drip and close monitoring of his renal function. Patient denies any chest pain, nausea vomiting or abdominal pain EXAM: CHEST - 2 VIEW COMPARISON:  07/26/2015 FINDINGS: Stable pleural effusions left greater than right. Adjacent atelectasis or infiltrate in the lung bases left greater than right as  before. Heart size upper limits normal. Previous CABG. Left subclavian AICD stable. Mild central pulmonary vascular congestion. No pneumothorax. Visualized skeletal structures are unremarkable. IMPRESSION: 1. Vascular congestion with stable small pleural effusions and bibasilar atelectasis/infiltrate. Electronically Signed   By: Corlis Leak M.D.   On: 08/05/2015 15:09   Dg Chest 2 View  07/26/2015  CLINICAL DATA:  CHF, COPD.  Atrial fib. EXAM: CHEST  2 VIEW  COMPARISON:  08/20/2014 FINDINGS: Left defibrillator remains in place, unchanged. Prior CABG. Cardiomegaly. Small bilateral pleural effusions are stable since prior study. Left basilar scarring or chronic atelectasis. Minimal right base atelectasis. No overt edema. IMPRESSION: Stable chronic small bilateral pleural effusions, left greater than right with chronic left base atelectasis or scarring and minimal right base atelectasis. Cardiomegaly. Electronically Signed   By: Charlett Nose M.D.   On: 07/26/2015 16:38      Assessment/Plan 1. ARF 2. Hyperkalemia. Should be improved with dialysis. 3. Volume overload/congestive heart failure. Should be improved with dialysis.   We will proceed with temporary dialysis catheter placement at this time.  Risks and benefits discussed with patient and/or family, and the catheter will be placed to allow immediate initiation of dialysis.  If the patient's renal function does not improve throughout the hospital course, we will be happy to place a tunneled dialysis catheter for long term use prior to discharge.     DEW,JASON, MD  08/08/2015 5:57 PM

## 2015-08-12 NOTE — Progress Notes (Signed)
Treatment end assessment 

## 2015-08-12 NOTE — Op Note (Signed)
  OPERATIVE NOTE   PROCEDURE: 1. Ultrasound guidance for vascular access right femoral vein 2. Placement of a 30 cm triple-lumen type dialysis catheter right femoral vein  PRE-OPERATIVE DIAGNOSIS: 1. Acute renal failure 2. Hyperkalemia  POST-OPERATIVE DIAGNOSIS: Same  SURGEON: Festus BarrenJason Mykeal Carrick, MD  ASSISTANT(S): None  ANESTHESIA: local  ESTIMATED BLOOD LOSS: Minimal   FINDING(S): 1. None  SPECIMEN(S): None  INDICATIONS:  Patient is a 80 y.o.male who presents with  acute renal failure and a BUN of over 150 and a potassium of over 7.  Risks and benefits were discussed, and informed consent was obtained..  DESCRIPTION: After obtaining full informed written consent, the patient was laid flat in the bed. The right groin was sterilely prepped and draped in a sterile surgical field was created. The right femoral vein was visualized with ultrasound and found to be widely patent. It was then accessed under direct guidance without difficulty with a Seldinger needle and a permanent image was recorded. A J-wire was then placed. After skin nick and dilatation, a 30 cm triple-lumen dialysis catheter was placed over the wire and the wire was removed. The lumens withdrew dark red nonpulsatile blood and flushed easily with sterile saline. The catheter was secured to the skin with 3 nylon sutures. Sterile dressing was placed.  COMPLICATIONS: None  CONDITION: Stable  Marlicia Sroka 08/11/2015 5:59 PM

## 2015-08-12 NOTE — Progress Notes (Signed)
Pre dialysis assessment 

## 2015-08-12 NOTE — Progress Notes (Signed)
Post dialysis assessment 

## 2015-08-12 NOTE — Progress Notes (Signed)
[facility]  ROUNDING NOTE   Subjective:   Patient was seen by my partner earlier today in clinic. Patient has failed outpatient torsemide therapy. States he stopped urinating last Wednesday. He has gained over 20 pounds. Complains of shortness of breath/dispnea on exertion.   Sent for direct admission where furosemide gtt was started. However labs returned with a potassium of 7.2 and creatinine of 9.28.   Dr. Lucky Cowboy was consulted for emergent hemodialysis.   Objective:  Vital signs in last 24 hours:  Temp:  [97.6 F (36.4 C)-98.1 F (36.7 C)] 97.6 F (36.4 C) (05/08 1936) Pulse Rate:  [57-62] 62 (05/08 2000) Resp:  [16-18] 17 (05/08 2000) BP: (85-99)/(55-79) 99/79 mmHg (05/08 2000) SpO2:  [95 %-100 %] 100 % (05/08 2000) Weight:  [90.22 kg (198 lb 14.4 oz)] 90.22 kg (198 lb 14.4 oz) (05/08 1355)  Weight change:  Filed Weights   08/17/2015 1355  Weight: 90.22 kg (198 lb 14.4 oz)    Intake/Output: I/O last 3 completed shifts: In: 243.3 [P.O.:240; I.V.:3.3] Out: -    Intake/Output this shift:     Physical Exam: General: NAD, laying in bed  Head: Normocephalic, atraumatic. Moist oral mucosal membranes  Eyes: Anicteric, PERRL  Neck: +JVD  Lungs:  Bibasilar crackles  Heart: Paced rhythm  Abdomen:  +distended  Extremities: 1+ peripheral edema.  Neurologic: Nonfocal, moving all four extremities  Skin: No lesions  Access: Right femoral temp HD catheter Dr. Lucky Cowboy 5/8    Basic Metabolic Panel:  Recent Labs Lab 08/26/2015 1412  NA 117*  K 7.2*  CL 83*  CO2 14*  GLUCOSE 230*  BUN 163*  CREATININE 9.28*  CALCIUM 8.6*    Liver Function Tests:  Recent Labs Lab 08/21/2015 1412  AST 27  ALT 26  ALKPHOS 111  BILITOT 2.5*  PROT 6.4*  ALBUMIN 3.1*   No results for input(s): LIPASE, AMYLASE in the last 168 hours. No results for input(s): AMMONIA in the last 168 hours.  CBC:  Recent Labs Lab 08/18/2015 1412  WBC 6.2  HGB 11.6*  HCT 34.6*  MCV 96.3   PLT 108*    Cardiac Enzymes: No results for input(s): CKTOTAL, CKMB, CKMBINDEX, TROPONINI in the last 168 hours.  BNP: Invalid input(s): POCBNP  CBG:  Recent Labs Lab 08/22/2015 1629 08/31/2015 1756  GLUCAP 198* 228*    Microbiology: Results for orders placed or performed in visit on 06/24/15  Microscopic Examination     Status: None   Collection Time: 06/25/15  4:27 PM  Result Value Ref Range Status   WBC, UA 0-5 0 -  5 /hpf Final   RBC, UA 0-2 0 -  2 /hpf Final   Epithelial Cells (non renal) 0-10 0 - 10 /hpf Final   Casts None seen None seen /lpf Final   Mucus, UA Present Not Estab. Final   Bacteria, UA Few None seen/Few Final    Coagulation Studies: No results for input(s): LABPROT, INR in the last 72 hours.  Urinalysis:  Recent Labs  08/22/2015 1707  COLORURINE YELLOW*  LABSPEC 1.013  PHURINE 5.0  GLUCOSEU NEGATIVE  HGBUR NEGATIVE  BILIRUBINUR NEGATIVE  KETONESUR NEGATIVE  PROTEINUR 100*  NITRITE NEGATIVE  LEUKOCYTESUR NEGATIVE      Imaging: Dg Chest 2 View  08/11/2015  CLINICAL DATA:  history of CAD s/p CABG, CVA with residual right sided weakness, COPD not on home o2, Atrial flutter s/p ablations and cardioversion on eliquis now, CKD stage 3, CHF with systolic dysfunction  EF 15% sent in from nephrology office for worsening pedal edema and weight gain in the last 2-3 weeks.He was just in the hospital 2 weeks ago for the same reason and was discharged on increased dose of torsemide after Lasix drip. His discharge weight was 184 pounds. When he went to see his nephrologist today, weight increased up to 207 pounds again. Worsening ascites in the abdomen and also worsening shortness of breath. He does complain of worsening shortness of breath, increasing dyspnea even with short distances and orthopnea. His urine output has been steadily decreasing.So he is being sent to the hospital for Lasix drip and close monitoring of his renal function. Patient denies any chest  pain, nausea vomiting or abdominal pain EXAM: CHEST - 2 VIEW COMPARISON:  07/26/2015 FINDINGS: Stable pleural effusions left greater than right. Adjacent atelectasis or infiltrate in the lung bases left greater than right as before. Heart size upper limits normal. Previous CABG. Left subclavian AICD stable. Mild central pulmonary vascular congestion. No pneumothorax. Visualized skeletal structures are unremarkable. IMPRESSION: 1. Vascular congestion with stable small pleural effusions and bibasilar atelectasis/infiltrate. Electronically Signed   By: Lucrezia Europe M.D.   On: 08/17/2015 15:09     Medications:   . DOBUTamine 2.5 mcg/kg/min (08/09/2015 1900)   . aspirin EC  81 mg Oral Daily  . cholecalciferol  1,000 Units Oral Daily  . docusate sodium  100 mg Oral BID  . ezetimibe  10 mg Oral Daily  . [START ON 08/13/2015] ferrous sulfate  325 mg Oral Q breakfast  . finasteride  5 mg Oral QHS  . insulin aspart  10 Units Subcutaneous TID WC  . magnesium oxide  400 mg Oral Daily  . multivitamin with minerals  1 tablet Oral Daily  . omega-3 acid ethyl esters  2 capsule Oral BID  . pantoprazole  40 mg Oral Daily  . pravastatin  80 mg Oral QHS  . pyridOXINE  50 mg Oral Daily  . sodium chloride flush  3 mL Intravenous Q12H  . tamsulosin  0.4 mg Oral BID  . tiotropium  18 mcg Inhalation Daily  . vitamin B-12  100 mcg Oral Daily  . vitamin E  400 Units Oral Daily   acetaminophen **OR** acetaminophen, ondansetron **OR** ondansetron (ZOFRAN) IV, polyethylene glycol  Assessment/ Plan:  Matthew [name] is a 80 y.o. white male with coronary artery disease status post CABG, CHF, hypertension, hyperlipidemia, COPD, diabetes mellitus, history of paroxysmal atrial fibrillation, BPH, ablation for atrial flutter, who was admitted on 08/17/2015 for Shortness of Breath Edema CKD  Problem list: 1. Acute renal failure with hyperkalemia and metabolic acidosis on chronic kidney disease stage IV: baseline creatinine  of 2.19, eGFR of 27 on 07/30/15.  Acute renal failure secondary to acute cardiorenal syndrome and/or over diuresis. Concern for limited recovery. Hyperkalemia despite being on loop diuretic and low potassium diet. Some concern for obstructive uropathy. Foley catheter placed.  - Emergent hemodialysis tonight. 2K bath. UF of 1.5 litre.  - Evaluate daily for dialysis need.   2. Hypertension and Acute systolic congestive heart failure: EF of 20-25%: failed outpatient diuretic therapy and now with hypotension requiring dobutamine gtt.  - hold diuretics due to renal falure  4. Anemia of chronic kidney disease: no indication to epo.    LOS: 0 Matthew [name] 5/8/20178:18 PM

## 2015-08-12 NOTE — Progress Notes (Signed)
Treatment initiation assessment 

## 2015-08-12 NOTE — H&P (Addendum)
[facility] - [place] at [facility]   PATIENT NAME: Matthew [name]    MR#:  [id number]  DATE OF BIRTH:  1936-03-02  DATE OF ADMISSION:  08/11/2015  PRIMARY CARE PHYSICIAN: Vonita Moss, MD   REQUESTING/REFERRING PHYSICIAN: Dr. Mady Haagensen  CHIEF COMPLAINT:  No chief complaint on file. Worsening Pedal Edema  HISTORY OF PRESENT ILLNESS:  Matthew [name]  is a 80 y.o. male with a known history of CAD s/p CABG, CVA with residual right sided weakness, COPD not on home o2, Atrial flutter s/p ablations and cardioversion on eliquis now, CKD stage 3, CHF with systolic dysfunction EF 15% sent in from nephrology office for worsening pedal edema and weight gain in the last 2-3 weeks. He was just in the hospital 2 weeks ago for the same reason and was discharged on increased dose of torsemide after Lasix drip. His discharge weight was 184 pounds. When he went to see his nephrologist today, weight increased up to 207 pounds again. Worsening ascites in the abdomen and also worsening shortness of breath. He does complain of worsening shortness of breath, increasing dyspnea even with short distances and orthopnea. His urine output has been steadily decreasing.  So he is being sent to the hospital for Lasix drip and close monitoring of his renal function. Patient denies any chest pain, nausea vomiting or abdominal pain.  PAST MEDICAL HISTORY:   Past Medical History  Diagnosis Date  . CAD (coronary artery disease)     s/p CABG  . Cerebral vascular accident [facility])     with residual right sided weakness  . Restrictive lung disease     Not on home o2  . A-fib [facility])     s/p ablation  . COPD (chronic obstructive pulmonary disease) (HCC)   . Chronic kidney disease     stage 3, GFR 31 at baseline  . Diabetes mellitus without complication (HCC)   . Type 2 diabetes mellitus with stage 3 chronic kidney disease (HCC)   . Cardiomyopathy (HCC)     Last EF 15% in Nov 2016  .  Hyperlipidemia   . Hypertension   . Diabetic retinopathy (HCC)   . BPH (benign prostatic hyperplasia)     PAST SURGICAL HISTORY:   Past Surgical History  Procedure Laterality Date  . Coronary artery bypass graft    . Pacemaker insertion    . Vasectomy    . Appendectomy    . Cardiac defibrillator placement      SOCIAL HISTORY:   Social History  Substance Use Topics  . Smoking status: Former Smoker    Types: Cigarettes, Cigars, Pipe    Quit date: 09/27/1968  . Smokeless tobacco: Former Neurosurgeon    Types: Chew    Quit date: 10/01/1979  . Alcohol Use: No    FAMILY HISTORY:   Family History  Problem Relation Age of Onset  . Diabetes Mother   . Other Father     Ruptured AAA  . CAD Mother     DRUG ALLERGIES:   Allergies  Allergen Reactions  . Heparin Other (See Comments)    Reaction:  Unknown   . Lipitor [Atorvastatin] Other (See Comments)    Reaction:  Memory issues   . Toujeo Solostar [Insulin Glargine] Other (See Comments)    Reaction:  Weight gain     REVIEW OF SYSTEMS:   Review of Systems  Constitutional: Positive for malaise/fatigue. Negative for fever, chills and weight loss.  HENT: Negative for ear discharge, ear  pain, nosebleeds and tinnitus.   Eyes: Positive for redness. Negative for blurred vision, double vision and photophobia.       Left eye conjunctival erythema  Respiratory: Positive for shortness of breath. Negative for cough, hemoptysis and wheezing.   Cardiovascular: Positive for leg swelling. Negative for chest pain, palpitations and orthopnea.  Gastrointestinal: Positive for abdominal pain. Negative for heartburn, nausea, vomiting, diarrhea, constipation and melena.       Abdominal distention  Genitourinary: Negative for dysuria, urgency and hematuria.       Decreased urinary frequency  Musculoskeletal: Negative for myalgias, back pain and neck pain.  Skin: Negative for rash.  Neurological: Negative for dizziness, tremors, sensory change,  speech change, focal weakness and headaches.  Endo/Heme/Allergies: Bruises/bleeds easily.  Psychiatric/Behavioral: Negative for depression.    MEDICATIONS AT HOME:   Prior to Admission medications   Medication Sig Start Date End Date Taking? Authorizing Provider  apixaban (ELIQUIS) 2.5 MG TABS tablet Take 2.5 mg by mouth 2 (two) times daily.    Yes Historical Provider, MD  aspirin EC 81 MG tablet Take 81 mg by mouth daily.    Yes Historical Provider, MD  B Complex-C (B-COMPLEX WITH VITAMIN C) tablet Take 1 tablet by mouth daily.   Yes Historical Provider, MD  carvedilol (COREG) 3.125 MG tablet Take 3.125 mg by mouth 2 (two) times daily with a meal.   Yes Historical Provider, MD  cholecalciferol (VITAMIN D) 1000 units tablet Take 1,000 Units by mouth daily.   Yes Historical Provider, MD  colesevelam (WELCHOL) 625 MG tablet Take 3 tablets (1,875 mg total) by mouth 2 (two) times daily. 11/20/14  Yes Steele Sizer, MD  ezetimibe (ZETIA) 10 MG tablet Take 1 tablet (10 mg total) by mouth daily. 11/20/14  Yes Steele Sizer, MD  ferrous sulfate 325 (65 FE) MG tablet Take 325 mg by mouth daily with breakfast.    Yes Historical Provider, MD  finasteride (PROSCAR) 5 MG tablet Take 1 tablet (5 mg total) by mouth at bedtime. 11/20/14  Yes Steele Sizer, MD  insulin lispro (HUMALOG KWIKPEN) 100 UNIT/ML KiwkPen Inject 8 Units into the skin 2 (two) times daily.   Yes Historical Provider, MD  Magnesium 250 MG TABS Take 250 mg by mouth daily.    Yes Historical Provider, MD  metolazone (ZAROXOLYN) 5 MG tablet Take 5 mg by mouth daily.   Yes Historical Provider, MD  omega-3 fish oil (MAXEPA) 1000 MG CAPS capsule Take 2 capsules by mouth 2 (two) times daily.   Yes Historical Provider, MD  pantoprazole (PROTONIX) 40 MG tablet Take 40 mg by mouth daily.   Yes Historical Provider, MD  pravastatin (PRAVACHOL) 80 MG tablet Take 1 tablet (80 mg total) by mouth at bedtime. 11/20/14  Yes Steele Sizer, MD  pyridOXINE  (VITAMIN B-6) 50 MG tablet Take 50 mg by mouth daily.   Yes Historical Provider, MD  sodium bicarbonate 650 MG tablet Take 1,300 mg by mouth 2 (two) times daily.    Yes Historical Provider, MD  spironolactone (ALDACTONE) 25 MG tablet Take 12.5 mg by mouth daily.    Yes Historical Provider, MD  tamsulosin (FLOMAX) 0.4 MG CAPS capsule Take 1 capsule (0.4 mg total) by mouth 2 (two) times daily. 11/20/14  Yes Steele Sizer, MD  telmisartan (MICARDIS) 40 MG tablet Take 40 mg by mouth daily.   Yes Historical Provider, MD  tiotropium (SPIRIVA) 18 MCG inhalation capsule Place 18 mcg into inhaler and inhale daily.  Yes Historical Provider, MD  torsemide (DEMADEX) 20 MG tablet Take 20 mg by mouth daily.    Yes Historical Provider, MD  vitamin B-12 (CYANOCOBALAMIN) 100 MCG tablet Take 100 mcg by mouth daily.    Yes Historical Provider, MD  vitamin E 400 UNIT capsule Take 400 Units by mouth daily.    Yes Historical Provider, MD      VITAL SIGNS:  Blood pressure 85/57, pulse 61, temperature 97.8 F (36.6 C), resp. rate 18, height  (1.803 m), weight 90.22 kg (198 lb 14.4 oz), SpO2 97 %.  PHYSICAL EXAMINATION:   Physical Exam  GENERAL:  80 y.o.-year-old patient lying in the bed with no acute distress.  EYES: Pupils equal, round, reactive to light and accommodation. No scleral icterus but left eye conjunctival erythema. Extraocular muscles intact.  HEENT: Head atraumatic, normocephalic. Oropharynx and nasopharynx clear.  NECK:  Supple, no jugular venous distention. No thyroid enlargement, no tenderness.  LUNGS: Normal breath sounds bilaterally, no wheezing, rales,rhonchi or crepitation. No use of accessory muscles of respiration. Bibasilar crackles CARDIOVASCULAR: S1, S2 normal. No rubs, or gallops. 2/6 systolic murmur present. ABDOMEN: Soft, nontender, distended. Bowel sounds present. No organomegaly or mass.  EXTREMITIES: No cyanosis, or clubbing. 3+ edema until the knees with fluid  blisters NEUROLOGIC: Cranial nerves II through XII are intact. Muscle strength 5/5 in all extremities. Sensation intact. Gait not checked.  PSYCHIATRIC: The patient is alert and oriented x 3.  SKIN: No obvious rash, lesion, or ulcer. Multiple bruises on abdomen and thighs from insulin injections  LABORATORY PANEL:   CBC No results for input(s): WBC, HGB, HCT, PLT in the last 168 hours. ------------------------------------------------------------------------------------------------------------------  Chemistries  No results for input(s): NA, K, CL, CO2, GLUCOSE, BUN, CREATININE, CALCIUM, MG, AST, ALT, ALKPHOS, BILITOT in the last 168 hours.  Invalid input(s): GFRCGP ------------------------------------------------------------------------------------------------------------------  Cardiac Enzymes No results for input(s): TROPONINI in the last 168 hours. ------------------------------------------------------------------------------------------------------------------  RADIOLOGY:  No results found.  EKG:   Orders placed or performed during the hospital encounter of 08/15/2015  . EKG 12-Lead  . EKG 12-Lead    IMPRESSION AND PLAN:   Jancarlo Biermann  is a 80 y.o. male with a known history of CAD s/p CABG, CVA with residual right sided weakness, COPD not on home o2, Atrial flutter s/p ablations and cardioversion on eliquis now, CKD stage 3, CHF with systolic dysfunction EF 15% sent in from nephrology office for worsening pedal edema and weight gain in the last 2-3 weeks.  #1 acute on chronic systolic CHF exacerbation- known EF less than 20% -Ischemic cardiomyopathy. -Cardiology consulted. Will be started on Lasix drip at this time. -Daily weights, strict input and output monitoring -Chest x-ray ordered - US abd and see if any ascites for paracentesis  #2 acute renal failure on CK D stage III-baseline GFR is 31, has been steadily decreasing  and worsening edema as  outpatient. -Cardiorenal syndrome -Nephrology consulted. Hold nephrotoxic medications. -Start on Lasix drip, monitor closely. Slight torsemide has not been helping him as outpatient. Might need to be discharged on Bumex. But this time he needs to be started in the hospital prior to discharge.  #4 CAD status post CABG-appears stable at this time. Status post bypass surgery. - cardiac meds on hold due to hypotension. Monitor closely. -recent echocardiogram with severe systolic dysfunction, EF of 20-25%  #5 atrial flutter-status post cardioversion and ablation in the past. Monitor on telemetry. -hold Coreg as BP low normal -Continue eliquis for anticoagulation  #6  GERD-on Protonix  #6 hyperlipidemia-continue home medications  #7 DVT prophylaxis-already on eliquis. Monitor hemoglobin    All the records are reviewed and case discussed with ED provider. Management plans discussed with the patient, family and they are in agreement.  CODE STATUS: Full Code  TOTAL CRITICAL CARE TIME SPENT IN  TAKING CARE OF THIS PATIENT: 65 minutes including treatment of hyperkalemia, discussing with consultants.    Enid BaasKALISETTI,Sabrina Arriaga M.D on 08/18/2015 at 2:01 PM  Between 7am to 6pm - Pager - [phone]  After 6pm go to [email or web address] - password [email or web address] [facility]  [facility]  Office  [phone]  CC: Primary care physician; Vonita MossMark Crissman, MD

## 2015-08-12 NOTE — Consult Note (Signed)
[facility] Cardiology  CARDIOLOGY CONSULT NOTE  Patient ID: Matthew [name] MRN: [id number] DOB/AGE: 11/08/1935 80 y.o.  Admit date: 08/14/2015 Referring Physician Nemiah Commander Primary Physician [facility] Primary Cardiologist Fath Reason for Consultation congestive heart failure  HPI: 80 year old gentleman referred for evaluation of congestive heart failure. The patient has known coronary disease, status post CABG, status post redo CABG, with ischemic cardiomyopathy, baseline LV ejection fraction of 15%. The patient was recently hospitalized for acute on chronic heart failure. He was discharged home on torsemide. He returns, with fluid retention and recurrent acute on chronic systolic congestive heart failure. Admission labs were notable for acute renal failure with BUN/creatinine of 163 and 9.28, respectively. The patient is also hyperkalemic with a potassium 7.2, with hyponatremia sodium of 117. The patient denies chest pain. He does have aggressive exertional dyspnea, with fluid retention, chronic peripheral edema.  Review of systems complete and found to be negative unless listed above     Past Medical History  Diagnosis Date  . CAD (coronary artery disease)     s/p CABG  . Cerebral vascular accident [facility])     with residual right sided weakness  . Restrictive lung disease     Not on home o2  . A-fib [facility])     s/p ablation  . COPD (chronic obstructive pulmonary disease) (HCC)   . Chronic kidney disease     stage 3, GFR 31 at baseline  . Diabetes mellitus without complication (HCC)   . Type 2 diabetes mellitus with stage 3 chronic kidney disease (HCC)   . Cardiomyopathy (HCC)     Last EF 15% in Nov 2016  . Hyperlipidemia   . Hypertension   . Diabetic retinopathy (HCC)   . BPH (benign prostatic hyperplasia)     Past Surgical History  Procedure Laterality Date  . Coronary artery bypass graft    . Pacemaker insertion    . Vasectomy    . Appendectomy    . Cardiac defibrillator  placement      Prescriptions prior to admission  Medication Sig Dispense Refill Last Dose  . apixaban (ELIQUIS) 2.5 MG TABS tablet Take 2.5 mg by mouth 2 (two) times daily.    Taking  . aspirin EC 81 MG tablet Take 81 mg by mouth daily.    Taking  . B Complex-C (B-COMPLEX WITH VITAMIN C) tablet Take 1 tablet by mouth daily.   Taking  . carvedilol (COREG) 3.125 MG tablet Take 3.125 mg by mouth 2 (two) times daily with a meal.   Taking  . cholecalciferol (VITAMIN D) 1000 units tablet Take 1,000 Units by mouth daily.   Taking  . colesevelam (WELCHOL) 625 MG tablet Take 3 tablets (1,875 mg total) by mouth 2 (two) times daily. 180 tablet 12 Taking  . ezetimibe (ZETIA) 10 MG tablet Take 1 tablet (10 mg total) by mouth daily. 30 tablet 12 Taking  . ferrous sulfate 325 (65 FE) MG tablet Take 325 mg by mouth daily with breakfast.    Taking  . finasteride (PROSCAR) 5 MG tablet Take 1 tablet (5 mg total) by mouth at bedtime. 30 tablet 12 Taking  . Magnesium 250 MG TABS Take 250 mg by mouth daily.    Taking  . NOVOLOG FLEXPEN 100 UNIT/ML FlexPen inject 15 units subcutaneously three times a day with meals  1 Taking  . omega-3 fish oil (MAXEPA) 1000 MG CAPS capsule Take 2 capsules by mouth 2 (two) times daily.   Taking  . pantoprazole (PROTONIX) 40  MG tablet Take 40 mg by mouth daily.   Taking  . pravastatin (PRAVACHOL) 80 MG tablet Take 1 tablet (80 mg total) by mouth at bedtime. 30 tablet 12 Taking  . pyridOXINE (VITAMIN B-6) 50 MG tablet Take 50 mg by mouth daily.   Taking  . saw palmetto 160 MG capsule Take 160 mg by mouth 2 (two) times daily.   Taking  . spironolactone (ALDACTONE) 25 MG tablet Take 12.5 mg by mouth daily.    Taking  . tamsulosin (FLOMAX) 0.4 MG CAPS capsule Take 1 capsule (0.4 mg total) by mouth 2 (two) times daily. 60 capsule 12 Taking  . telmisartan (MICARDIS) 80 MG tablet Take 40 mg by mouth daily.   Taking  . tiotropium (SPIRIVA) 18 MCG inhalation capsule Place 18 mcg into inhaler  and inhale daily.   Taking  . torsemide (DEMADEX) 10 MG tablet Take 3 tablets (30 mg total) by mouth daily. (Patient taking differently: Take 40 mg by mouth daily. ) 90 tablet 0 Taking  . vitamin B-12 (CYANOCOBALAMIN) 100 MCG tablet Take 100 mcg by mouth daily.    Taking  . vitamin E 400 UNIT capsule Take 400 Units by mouth daily.    Taking   Social History   Social History  . Marital Status: Married    Spouse Name: N/A  . Number of Children: N/A  . Years of Education: N/A   Occupational History  . Not on file.   Social History Main Topics  . Smoking status: Former Smoker    Types: Cigarettes, Cigars, Pipe    Quit date: 09/27/1968  . Smokeless tobacco: Former Neurosurgeon    Types: Chew    Quit date: 10/01/1979  . Alcohol Use: No  . Drug Use: No  . Sexual Activity: Not on file   Other Topics Concern  . Not on file   Social History Narrative   Lives at home with wife    Family History  Problem Relation Age of Onset  . Diabetes Mother   . Other Father     Ruptured AAA  . CAD Mother       Review of systems complete and found to be negative unless listed above      PHYSICAL EXAM  General: Well developed, well nourished, in no acute distress HEENT:  Normocephalic and atramatic Neck:  No JVD.  Lungs: Clear bilaterally to auscultation and percussion. Heart: HRRR . Normal S1 and S2 without gallops or murmurs.  Abdomen: Bowel sounds are positive, abdomen soft and non-tender  Msk:  Back normal, normal gait. Normal strength and tone for age. Extremities: No clubbing, cyanosis or edema.   Neuro: Alert and oriented X 3. Psych:  Good affect, responds appropriately  Labs:   Lab Results  Component Value Date   WBC 6.2 08-17-2015   HGB 11.6* 08/17/2015   HCT 34.6* 08-17-2015   MCV 96.3 2015-08-17   PLT 108* 08-17-2015    Recent Labs Lab 08-17-15 1412  NA 117*  K 7.2*  CL 83*  CO2 14*  BUN 163*  CREATININE 9.28*  CALCIUM 8.6*  PROT 6.4*  BILITOT 2.5*  ALKPHOS  111  ALT 26  AST 27  GLUCOSE 230*   No results found for: CKTOTAL, CKMB, CKMBINDEX, TROPONINI Lab Results  Component Value Date   CHOL 129 05/27/2015   CHOL 119 11/20/2014   Lab Results  Component Value Date   HDL 54 05/27/2015   HDL 46 11/20/2014   Lab Results  Component  Value Date   LDLCALC 61 05/27/2015   LDLCALC 56 11/20/2014   Lab Results  Component Value Date   TRIG 72 05/27/2015   TRIG 87 11/20/2014   Lab Results  Component Value Date   CHOLHDL 2.4 05/27/2015   CHOLHDL 2.6 11/20/2014   No results found for: LDLDIRECT    Radiology: Dg Chest 2 View  08/15/2015  CLINICAL DATA:  history of CAD s/p CABG, CVA with residual right sided weakness, COPD not on home o2, Atrial flutter s/p ablations and cardioversion on eliquis now, CKD stage 3, CHF with systolic dysfunction EF 15% sent in from nephrology office for worsening pedal edema and weight gain in the last 2-3 weeks.He was just in the hospital 2 weeks ago for the same reason and was discharged on increased dose of torsemide after Lasix drip. His discharge weight was 184 pounds. When he went to see his nephrologist today, weight increased up to 207 pounds again. Worsening ascites in the abdomen and also worsening shortness of breath. He does complain of worsening shortness of breath, increasing dyspnea even with short distances and orthopnea. His urine output has been steadily decreasing.So he is being sent to the hospital for Lasix drip and close monitoring of his renal function. Patient denies any chest pain, nausea vomiting or abdominal pain EXAM: CHEST - 2 VIEW COMPARISON:  07/26/2015 FINDINGS: Stable pleural effusions left greater than right. Adjacent atelectasis or infiltrate in the lung bases left greater than right as before. Heart size upper limits normal. Previous CABG. Left subclavian AICD stable. Mild central pulmonary vascular congestion. No pneumothorax. Visualized skeletal structures are unremarkable. IMPRESSION: 1.  Vascular congestion with stable small pleural effusions and bibasilar atelectasis/infiltrate. Electronically Signed   By: Corlis Leak  Hassell M.D.   On: 10-03-2015 15:09   Dg Chest 2 View  07/26/2015  CLINICAL DATA:  CHF, COPD.  Atrial fib. EXAM: CHEST  2 VIEW COMPARISON:  08/20/2014 FINDINGS: Left defibrillator remains in place, unchanged. Prior CABG. Cardiomegaly. Small bilateral pleural effusions are stable since prior study. Left basilar scarring or chronic atelectasis. Minimal right base atelectasis. No overt edema. IMPRESSION: Stable chronic small bilateral pleural effusions, left greater than right with chronic left base atelectasis or scarring and minimal right base atelectasis. Cardiomegaly. Electronically Signed   By: Charlett NoseKevin  Dover M.D.   On: 07/26/2015 16:38    EKG: Atrial and ventricular pacing  ASSESSMENT AND PLAN:   1. Acute on chronic systolic congestive heart failure, exacerbated by acute renal failure 2. Status post CABG, currently without chest pain 3. Ischemic cardiomyopathy 4. Acute renal failure with hyperkalemia, and hypokalemia  Recommendations  1. Agree with current therapy 2. Proceed with emergent dialysis 3. Hold Eliquis for now 4. Continue carvedilol 5. Further cardiac recommendations pending patient's initial clinical course  Signed: Mercede Rollo MD,PhD, [facility] Center[facility] 09/01/2015, 5:16 PM

## 2015-08-12 NOTE — Progress Notes (Signed)
Blood pressure 83/52 and that I was about to start his lasix drip at 8. Dr. Nemiah CommanderKalisetti notified and stated to go ahead start drip at 4 at this time and continue to monitor.

## 2015-08-12 NOTE — Progress Notes (Signed)
Potassium 7.2 Dr. Nemiah CommanderKalisetti notified. Stated to redraw potassium

## 2015-08-13 LAB — GLUCOSE, CAPILLARY
GLUCOSE-CAPILLARY: 178 mg/dL — AB (ref 65–99)
Glucose-Capillary: 174 mg/dL — ABNORMAL HIGH (ref 65–99)
Glucose-Capillary: 237 mg/dL — ABNORMAL HIGH (ref 65–99)

## 2015-08-13 LAB — MRSA PCR SCREENING: MRSA BY PCR: NEGATIVE

## 2015-08-13 LAB — BASIC METABOLIC PANEL
ANION GAP: 17 — AB (ref 5–15)
BUN: 97 mg/dL — AB (ref 6–20)
CALCIUM: 8.9 mg/dL (ref 8.9–10.3)
CO2: 21 mmol/L — ABNORMAL LOW (ref 22–32)
Chloride: 91 mmol/L — ABNORMAL LOW (ref 101–111)
Creatinine, Ser: 6.3 mg/dL — ABNORMAL HIGH (ref 0.61–1.24)
GFR calc Af Amer: 9 mL/min — ABNORMAL LOW (ref >60)
GFR, EST NON AFRICAN AMERICAN: 8 mL/min — AB (ref >60)
GLUCOSE: 157 mg/dL — AB (ref 65–99)
Potassium: 5.2 mmol/L — ABNORMAL HIGH (ref 3.5–5.1)
SODIUM: 129 mmol/L — AB (ref 135–145)

## 2015-08-13 LAB — RENAL FUNCTION PANEL
Albumin: 2.9 g/dL — ABNORMAL LOW (ref 3.5–5.0)
Anion gap: 16 — ABNORMAL HIGH (ref 5–15)
BUN: 125 mg/dL — ABNORMAL HIGH (ref 6–20)
CALCIUM: 8.5 mg/dL — AB (ref 8.9–10.3)
CO2: 19 mmol/L — ABNORMAL LOW (ref 22–32)
Chloride: 89 mmol/L — ABNORMAL LOW (ref 101–111)
Creatinine, Ser: 7.79 mg/dL — ABNORMAL HIGH (ref 0.61–1.24)
GFR, EST AFRICAN AMERICAN: 7 mL/min — AB (ref >60)
GFR, EST NON AFRICAN AMERICAN: 6 mL/min — AB (ref >60)
Glucose, Bld: 78 mg/dL (ref 65–99)
Phosphorus: 8.1 mg/dL — ABNORMAL HIGH (ref 2.5–4.6)
Potassium: 5.7 mmol/L — ABNORMAL HIGH (ref 3.5–5.1)
SODIUM: 124 mmol/L — AB (ref 135–145)

## 2015-08-13 LAB — CBC
HCT: 31 % — ABNORMAL LOW (ref 40.0–52.0)
HCT: 35 % — ABNORMAL LOW (ref 40.0–52.0)
HEMOGLOBIN: 12 g/dL — AB (ref 13.0–18.0)
Hemoglobin: 10.7 g/dL — ABNORMAL LOW (ref 13.0–18.0)
MCH: 32.5 pg (ref 26.0–34.0)
MCH: 32.6 pg (ref 26.0–34.0)
MCHC: 34.4 g/dL (ref 32.0–36.0)
MCHC: 34.4 g/dL (ref 32.0–36.0)
MCV: 94.5 fL (ref 80.0–100.0)
MCV: 94.9 fL (ref 80.0–100.0)
PLATELETS: 103 10*3/uL — AB (ref 150–440)
Platelets: 124 10*3/uL — ABNORMAL LOW (ref 150–440)
RBC: 3.29 MIL/uL — AB (ref 4.40–5.90)
RBC: 3.69 MIL/uL — ABNORMAL LOW (ref 4.40–5.90)
RDW: 15.4 % — ABNORMAL HIGH (ref 11.5–14.5)
RDW: 15.8 % — AB (ref 11.5–14.5)
WBC: 5.2 10*3/uL (ref 3.8–10.6)
WBC: 8.2 10*3/uL (ref 3.8–10.6)

## 2015-08-13 LAB — PHOSPHORUS: Phosphorus: 6.2 mg/dL — ABNORMAL HIGH (ref 2.5–4.6)

## 2015-08-13 LAB — TROPONIN I: TROPONIN I: 0.1 ng/mL — AB (ref <0.031)

## 2015-08-13 LAB — MAGNESIUM: MAGNESIUM: 2.9 mg/dL — AB (ref 1.7–2.4)

## 2015-08-13 MED ORDER — INSULIN ASPART 100 UNIT/ML ~~LOC~~ SOLN
0.0000 [IU] | Freq: Every day | SUBCUTANEOUS | Status: DC
  Administered 2015-08-13: 2 [IU] via SUBCUTANEOUS
  Filled 2015-08-13: qty 2

## 2015-08-13 MED ORDER — NOREPINEPHRINE 4 MG/250ML-% IV SOLN
INTRAVENOUS | Status: AC
  Filled 2015-08-13: qty 250

## 2015-08-13 MED ORDER — INSULIN ASPART 100 UNIT/ML ~~LOC~~ SOLN
0.0000 [IU] | Freq: Three times a day (TID) | SUBCUTANEOUS | Status: DC
  Administered 2015-08-14: 5 [IU] via SUBCUTANEOUS
  Administered 2015-08-14: 8 [IU] via SUBCUTANEOUS
  Filled 2015-08-13: qty 8
  Filled 2015-08-13: qty 5

## 2015-08-13 MED ORDER — ANTICOAGULANT SODIUM CITRATE 4% (200MG/5ML) IV SOLN
5.0000 mL | Status: DC | PRN
  Filled 2015-08-13: qty 250

## 2015-08-13 MED ORDER — APIXABAN 5 MG PO TABS
2.5000 mg | ORAL_TABLET | Freq: Two times a day (BID) | ORAL | Status: DC
  Administered 2015-08-13 (×2): 2.5 mg via ORAL
  Filled 2015-08-13 (×2): qty 1

## 2015-08-13 MED ORDER — NEPRO/CARBSTEADY PO LIQD
237.0000 mL | Freq: Two times a day (BID) | ORAL | Status: DC
  Administered 2015-08-14 (×2): 237 mL via ORAL

## 2015-08-13 MED ORDER — NOREPINEPHRINE BITARTRATE 1 MG/ML IV SOLN
0.0000 ug/min | INTRAVENOUS | Status: DC
  Administered 2015-08-13: 10 ug/min via INTRAVENOUS
  Filled 2015-08-13 (×2): qty 16

## 2015-08-13 MED ORDER — ALBUMIN HUMAN 25 % IV SOLN
12.5000 g | Freq: Once | INTRAVENOUS | Status: AC
  Administered 2015-08-13: 12.5 g via INTRAVENOUS
  Filled 2015-08-13: qty 50

## 2015-08-13 MED ORDER — DEXTROSE 5 % IV SOLN
0.0000 ug/min | INTRAVENOUS | Status: DC
  Administered 2015-08-13: 5 ug/min via INTRAVENOUS
  Filled 2015-08-13: qty 4

## 2015-08-13 MED ORDER — SODIUM CHLORIDE 0.9 % IV BOLUS (SEPSIS)
250.0000 mL | Freq: Once | INTRAVENOUS | Status: AC
  Administered 2015-08-13: 250 mL via INTRAVENOUS

## 2015-08-13 MED ORDER — DOPAMINE-DEXTROSE 3.2-5 MG/ML-% IV SOLN
0.0000 ug/kg/min | INTRAVENOUS | Status: DC
  Administered 2015-08-13: 3 ug/kg/min via INTRAVENOUS
  Administered 2015-08-14: 5 ug/kg/min via INTRAVENOUS
  Filled 2015-08-13 (×2): qty 250

## 2015-08-13 MED ORDER — SEVELAMER CARBONATE 800 MG PO TABS
800.0000 mg | ORAL_TABLET | Freq: Three times a day (TID) | ORAL | Status: DC
  Administered 2015-08-13 – 2015-08-14 (×3): 800 mg via ORAL
  Filled 2015-08-13 (×3): qty 1

## 2015-08-13 MED ORDER — CETYLPYRIDINIUM CHLORIDE 0.05 % MT LIQD
7.0000 mL | Freq: Two times a day (BID) | OROMUCOSAL | Status: DC
  Administered 2015-08-13 – 2015-08-14 (×2): 7 mL via OROMUCOSAL

## 2015-08-13 NOTE — Progress Notes (Signed)
This am dopamine gtt was added as recommended by Dr Darrold JunkerParaschos.   HD again today in room. Albumin given by HD nurse. Dopamine increased to treat sbp less than 90 during treatment.  1.5 L NS  removed . At 1100 pt stopped v pacing and HR 100-110/min. Around 1200 Levophed gtt was added and 250 ml NS bolus was given for decreased bp. Dr Ardyth Manam was in room to examine.  Dopamine gtt was decreased as able in hopes to decrease tachycardia..  Around 1235 pt noted to have some increase in HR  (140-150) SVT vs VT.   And a few  3 beats run VT noted.  No AICD firing noted and pt denies being shocked. Dr Ardyth Manam and Dr Belia HemanKasa aware.  BMP, CBC, Mag, Phos,troponin drawn. Pt currently on Dopamine at 4mcg and levophed at 10mcg. and Dobutamine at 2.685mcg.Currently pacing at 92/min.

## 2015-08-13 NOTE — Progress Notes (Signed)
[facility]  ROUNDING NOTE   Subjective:   Wife at bedside.  Hemodialysis emergently last night for hyperkalemia.  Currently on dopamine and dobutamine gtt as per cards.  Hemodialysis for later today.   Objective:  Vital signs in last 24 hours:  Temp:  [96.5 F (35.8 C)-98.1 F (36.7 C)] 96.5 F (35.8 C) (05/09 0100) Pulse Rate:  [57-67] 66 (05/09 0800) Resp:  [12-21] 21 (05/09 0800) BP: (80-99)/(44-79) 88/48 mmHg (05/09 0800) SpO2:  [95 %-100 %] 97 % (05/09 0800) Weight:  [90.22 kg (198 lb 14.4 oz)] 90.22 kg (198 lb 14.4 oz) (05/08 1355)  Weight change:  Filed Weights   08/15/2015 1355  Weight: 90.22 kg (198 lb 14.4 oz)    Intake/Output: I/O last 3 completed shifts: In: 484.7 [P.O.:440; I.V.:44.7] Out: 778 [Urine:180; Other:598]   Intake/Output this shift:  Total I/O In: 3.4 [I.V.:3.4] Out: -   Physical Exam: General: NAD, laying in bed  Head: Normocephalic, atraumatic. Moist oral mucosal membranes  Eyes: Anicteric, PERRL  Neck: supple  Lungs:  Bibasilar crackles  Heart: Paced rhythm  Abdomen:  +distended  Extremities: trace peripheral edema.  Neurologic: Nonfocal, moving all four extremities  Skin: No lesions  Access: Right femoral temp HD catheter Dr. Lucky Cowboy 5/8    Basic Metabolic Panel:  Recent Labs Lab 08/13/2015 1412 08/25/2015 1930 08/13/15 0323  NA 117* 121* 124*  K 7.2* 6.9* 5.7*  CL 83* 85* 89*  CO2 14* 16* 19*  GLUCOSE 230* 104* 78  BUN 163* 162* 125*  CREATININE 9.28* 9.13* 7.79*  CALCIUM 8.6* 9.4 8.5*  PHOS  --  10.8* 8.1*    Liver Function Tests:  Recent Labs Lab 08/26/2015 1412 08/23/2015 1930 08/13/15 0323  AST 27  --   --   ALT 26  --   --   ALKPHOS 111  --   --   BILITOT 2.5*  --   --   PROT 6.4*  --   --   ALBUMIN 3.1* 3.0* 2.9*   No results for input(s): LIPASE, AMYLASE in the last 168 hours. No results for input(s): AMMONIA in the last 168 hours.  CBC:  Recent Labs Lab 08/21/2015 1412 08/13/15 0323  WBC  6.2 5.2  HGB 11.6* 10.7*  HCT 34.6* 31.0*  MCV 96.3 94.5  PLT 108* 103*    Cardiac Enzymes: No results for input(s): CKTOTAL, CKMB, CKMBINDEX, TROPONINI in the last 168 hours.  BNP: Invalid input(s): POCBNP  CBG:  Recent Labs Lab 08/14/2015 1629 08/14/2015 1756  GLUCAP 198* 228*    Microbiology: Results for orders placed or performed during the hospital encounter of 09/01/2015  MRSA PCR Screening     Status: None   Collection Time: 08/13/15  1:35 AM  Result Value Ref Range Status   MRSA by PCR NEGATIVE NEGATIVE Final    Comment:        The GeneXpert MRSA Assay (FDA approved for NASAL specimens only), is one component of a comprehensive MRSA colonization surveillance program. It is not intended to diagnose MRSA infection nor to guide or monitor treatment for MRSA infections.     Coagulation Studies: No results for input(s): LABPROT, INR in the last 72 hours.  Urinalysis:  Recent Labs  08/15/2015 1707  COLORURINE YELLOW*  LABSPEC 1.013  PHURINE 5.0  GLUCOSEU NEGATIVE  HGBUR NEGATIVE  BILIRUBINUR NEGATIVE  KETONESUR NEGATIVE  PROTEINUR 100*  NITRITE NEGATIVE  LEUKOCYTESUR NEGATIVE      Imaging: Dg Chest 2 View  08/17/2015  CLINICAL DATA:  history of CAD s/p CABG, CVA with residual right sided weakness, COPD not on home o2, Atrial flutter s/p ablations and cardioversion on eliquis now, CKD stage 3, CHF with systolic dysfunction EF 35% sent in from nephrology office for worsening pedal edema and weight gain in the last 2-3 weeks.He was just in the hospital 2 weeks ago for the same reason and was discharged on increased dose of torsemide after Lasix drip. His discharge weight was 184 pounds. When he went to see his nephrologist today, weight increased up to 207 pounds again. Worsening ascites in the abdomen and also worsening shortness of breath. He does complain of worsening shortness of breath, increasing dyspnea even with short distances and orthopnea. His urine  output has been steadily decreasing.So he is being sent to the hospital for Lasix drip and close monitoring of his renal function. Patient denies any chest pain, nausea vomiting or abdominal pain EXAM: CHEST - 2 VIEW COMPARISON:  07/26/2015 FINDINGS: Stable pleural effusions left greater than right. Adjacent atelectasis or infiltrate in the lung bases left greater than right as before. Heart size upper limits normal. Previous CABG. Left subclavian AICD stable. Mild central pulmonary vascular congestion. No pneumothorax. Visualized skeletal structures are unremarkable. IMPRESSION: 1. Vascular congestion with stable small pleural effusions and bibasilar atelectasis/infiltrate. Electronically Signed   By: Matthew [name] M.D.   On: 08/15/2015 15:09     Medications:   . DOBUTamine 2.5 mcg/kg/min (08/13/15 0700)  . DOPamine 4 mcg/kg/min (08/13/15 0830)   . aspirin EC  81 mg Oral Daily  . cholecalciferol  1,000 Units Oral Daily  . docusate sodium  100 mg Oral BID  . ezetimibe  10 mg Oral Daily  . ferrous sulfate  325 mg Oral Q breakfast  . finasteride  5 mg Oral QHS  . insulin aspart  10 Units Subcutaneous TID WC  . magnesium oxide  400 mg Oral Daily  . multivitamin with minerals  1 tablet Oral Daily  . omega-3 acid ethyl esters  2 capsule Oral BID  . pantoprazole  40 mg Oral Daily  . pravastatin  80 mg Oral QHS  . pyridOXINE  50 mg Oral Daily  . sodium chloride flush  3 mL Intravenous Q12H  . tamsulosin  0.4 mg Oral BID  . tiotropium  18 mcg Inhalation Daily  . vitamin B-12  100 mcg Oral Daily  . vitamin E  400 Units Oral Daily   acetaminophen **OR** acetaminophen, anticoagulant sodium citrate, ondansetron **OR** ondansetron (ZOFRAN) IV, polyethylene glycol  Assessment/ Plan:  Matthew [name] is a 80 y.o. white male with coronary artery disease status post CABG, CHF, hypertension, hyperlipidemia, COPD, diabetes mellitus, history of paroxysmal atrial fibrillation, BPH, ablation for atrial  flutter, who was admitted on 08/27/2015 for Shortness of Breath Edema CKD  1. Acute renal failure with hyperkalemia and metabolic acidosis on chronic kidney disease stage IV: baseline creatinine of 2.19, eGFR of 27 on 07/30/15.  BUN and potassium still critically elevated.  Acute renal failure secondary to acute cardiorenal syndrome and/or over diuresis. Concern for limited recovery. Hyperkalemia despite being on loop diuretic and low potassium diet. Some concern for obstructive uropathy. Foley catheter placed.  - hemodialysis treatment for today. 1.5 hour treatment. IV albumin with treatment. Na 135.  - Evaluate daily for dialysis need.   2. Hypertension and Acute systolic congestive heart failure: EF of 20-25%: failed outpatient diuretic therapy and now with hypotension requiring dobutamine gtt and dopamine gtt.  -  hold diuretics due to renal falure - Appreciate cardiology input.   3. Secondary hyperparathyroidism of renal origin: hyperphosphatemia - not currently on binder: start renvela.   4. Anemia of chronic kidney disease: no indication to epo.    LOS: 1 Banita Lehn 5/9/20178:39 AM

## 2015-08-13 NOTE — Progress Notes (Signed)
Post dialysis assessment 

## 2015-08-13 NOTE — Progress Notes (Signed)
Treatment completion assessment 

## 2015-08-13 NOTE — Progress Notes (Signed)
Pre dialysis assessment 

## 2015-08-13 NOTE — Progress Notes (Signed)
[facility] - [place] at [facility]   PATIENT NAME: Matthew [name]    MR#:  [id number]  DATE OF BIRTH:  1935/04/27  SUBJECTIVE:  CHIEF COMPLAINT:  Patient denies any shortness of breath or chest pain while resting. Getting temporary hemodialysis today.   REVIEW OF SYSTEMS:  CONSTITUTIONAL: No fever, fatigue or weakness.  EYES: No blurred or double vision.  EARS, NOSE, AND THROAT: No tinnitus or ear pain.  RESPIRATORY: No cough, reports exertional shortness of breath, denies wheezing or hemoptysis.  CARDIOVASCULAR: No chest pain, orthopnea, edema.  GASTROINTESTINAL: No nausea, vomiting, diarrhea or abdominal pain.  GENITOURINARY: No dysuria, hematuria.  ENDOCRINE: No polyuria, nocturia,  HEMATOLOGY: No anemia, easy bruising or bleeding SKIN: No rash or lesion. MUSCULOSKELETAL: No joint pain or arthritis.   NEUROLOGIC: No tingling, numbness, weakness.  PSYCHIATRY: No anxiety or depression.   DRUG ALLERGIES:   Allergies  Allergen Reactions  . Heparin Other (See Comments)    Reaction:  Unknown   . Lipitor [Atorvastatin] Other (See Comments)    Reaction:  Memory issues   . Toujeo Solostar [Insulin Glargine] Other (See Comments)    Reaction:  Weight gain     VITALS:  Blood pressure 84/54, pulse 88, temperature 97.5 F (36.4 C), temperature source Axillary, resp. rate 22, height  (1.803 m), weight 90.22 kg (198 lb 14.4 oz), SpO2 98 %.  PHYSICAL EXAMINATION:  GENERAL:  80 y.o.-year-old patient lying in the bed with no acute distress.  EYES: Pupils equal, round, reactive to light and accommodation. No scleral icterus. Extraocular muscles intact.  HEENT: Head atraumatic, normocephalic. Oropharynx and nasopharynx clear.  NECK:  Supple, no jugular venous distention. No thyroid enlargement, no tenderness.  LUNGS: Moderate breath sounds bilaterally, diminished at the bases, no wheezing, rales,rhonchi or crepitation. No use of accessory muscles of  respiration.  CARDIOVASCULAR: S1, S2 normal. No murmurs, rubs, or gallops.  ABDOMEN: Soft, nontender, nondistended. Bowel sounds present. No organomegaly or mass.  EXTREMITIES: No pedal edema, cyanosis, or clubbing. Groin with temporary dialysis catheter NEUROLOGIC: Cranial nerves II through XII are intact. Muscle strength 5/5 in all extremities. Sensation intact. Gait not checked.  PSYCHIATRIC: The patient is alert and oriented x 3.  SKIN: No obvious rash, lesion, or ulcer.    LABORATORY PANEL:   CBC  Recent Labs Lab 08/13/15 0323  WBC 5.2  HGB 10.7*  HCT 31.0*  PLT 103*   ------------------------------------------------------------------------------------------------------------------  Chemistries   Recent Labs Lab 08/30/2015 1412  08/13/15 0323  NA 117*  < > 124*  K 7.2*  < > 5.7*  CL 83*  < > 89*  CO2 14*  < > 19*  GLUCOSE 230*  < > 78  BUN 163*  < > 125*  CREATININE 9.28*  < > 7.79*  CALCIUM 8.6*  < > 8.5*  AST 27  --   --   ALT 26  --   --   ALKPHOS 111  --   --   BILITOT 2.5*  --   --   < > = values in this interval not displayed. ------------------------------------------------------------------------------------------------------------------  Cardiac Enzymes No results for input(s): TROPONINI in the last 168 hours. ------------------------------------------------------------------------------------------------------------------  RADIOLOGY:  Dg Chest 2 View  08/24/2015  CLINICAL DATA:  history of CAD s/p CABG, CVA with residual right sided weakness, COPD not on home o2, Atrial flutter s/p ablations and cardioversion on eliquis now, CKD stage 3, CHF with systolic dysfunction EF 15% sent in from nephrology office for  worsening pedal edema and weight gain in the last 2-3 weeks.He was just in the hospital 2 weeks ago for the same reason and was discharged on increased dose of torsemide after Lasix drip. His discharge weight was 184 pounds. When he went to see his  nephrologist today, weight increased up to 207 pounds again. Worsening ascites in the abdomen and also worsening shortness of breath. He does complain of worsening shortness of breath, increasing dyspnea [name] with short distances and orthopnea. His urine output has been steadily decreasing.So he is being sent to the hospital for Lasix drip and close monitoring of his renal function. Patient denies any chest pain, nausea vomiting or abdominal pain EXAM: CHEST - 2 VIEW COMPARISON:  07/26/2015 FINDINGS: Stable pleural effusions left greater than right. Adjacent atelectasis or infiltrate in the lung bases left greater than right as before. Heart size upper limits normal. Previous CABG. Left subclavian AICD stable. Mild central pulmonary vascular congestion. No pneumothorax. Visualized skeletal structures are unremarkable. IMPRESSION: 1. Vascular congestion with stable small pleural effusions and bibasilar atelectasis/infiltrate. Electronically Signed   By: Corlis Leak  Hassell M.D.   On: 08/18/2015 15:09    EKG:   Orders placed or performed during the hospital encounter of 09/01/2015  . EKG 12-Lead  . EKG 12-Lead    ASSESSMENT AND PLAN:   Matthew [name] is a 80 y.o. male with a known history of CAD s/p CABG, CVA with residual right sided weakness, COPD not on home o2, Atrial flutter s/p ablations and cardioversion on eliquis now, CKD stage 3, CHF with systolic dysfunction EF 15% sent in from nephrology office for worsening pedal edema and weight gain in the last 2-3 weeks.  #1 acute on chronic systolic CHF exacerbation with history of ischemic cardiomyopathy known EF less than 20% -Clinically feeling better with temporary dialysis -Appreciate cardiology and nephrology recommendations -Daily weights, strict input and output monitoring   #2 acute renal failure on CK D stage III-baseline, creatinine at 2.1 GFR is 31, has been steadily decreasing and worsening edema as outpatient. -Cardiorenal syndrome On  low-dose dopamine infusion for hypotension- -Appreciate nephrology recommendations .Hold nephrotoxic medications. -Continue temporary dialysis and he might need permanent hemodialysis in future  #3 hyperkalemia and hyponatremia  Getting temporary hemodialysis. Potassium is better improved from 7.2-5.7, sodium improved from 117-124. Continue close monitoring and follow-up labs and follow-up with nephrology  #4 CAD status post CABG-appears stable at this time. Status post bypass surgery. - cardiac meds on hold due to hypotension. Monitor closely. -recent echocardiogram with severe systolic dysfunction, EF of 20-25% -Seen and evaluated by Dr. Evette GeorgesParashos. Appreciate his recommendations  #5 atrial flutter-status post cardioversion and ablation in the past. Monitor on telemetry. -hold Coreg as BP low normal -Continue eliquis for anticoagulation  #6 GERD-on Protonix  #7 hyperlipidemia-continue home medications  #8. Diabetes mellitus-check hemoglobin A1c, place him on NovoLog sliding scale with Accu-Cheks  #9 DVT prophylaxis-already on eliquis. Monitor hemoglobin        All the records are reviewed and case discussed with Care Management/Social Workerr. Management plans discussed with the patient and he is  in agreement.  CODE STATUS: fc   TOTAL CRITICAL CARE TIME TAKING CARE OF THIS PATIENT: 35 minutes.   POSSIBLE D/C IN ? DAYS, DEPENDING ON CLINICAL CONDITION.   Ramonita LabGouru, Marijke Guadiana M.D on 08/13/2015 at 10:57 AM  Between 7am to 6pm - Pager - [phone] After 6pm go to [email or web address] - password [email or web address] [facility]  [facility]  Office  [phone]  CC:  Primary care physician; Vonita Moss, MD

## 2015-08-13 NOTE — Progress Notes (Signed)
Patient awake and alert overnight, patient complains this am of not getting much sleep.  Tolerated dialysis maintaining BP WDL, Dobutamine remains. No complaints of pain or further need. See CHL for update.

## 2015-08-13 NOTE — Progress Notes (Signed)
CCM not consulted but following peripherally, discussed on Multidisciplinary rounds.  Noted EF 15% on HD. On dobutamine/dopamine infusing for renal protective and BP, via trialysis catheter.  Pt now appears hemodynamically stable. Continue management per primary/nephro/cards.   -Recommended DVT prophylaxis, palliative consult for goals of care.   -Deep Latissa Frick.  08/13/2015

## 2015-08-13 NOTE — Progress Notes (Signed)
[facility]  SUBJECTIVE: I feel about the same   Filed Vitals:   08/13/15 0300 08/13/15 0400 08/13/15 0500 08/13/15 0600  BP: 88/57 94/48 91/46  84/53  Pulse: 67 61 59 62  Temp:      TempSrc:      Resp: 17 16 16 16   Height:      Weight:      SpO2: 97% 97% 96% 98%     Intake/Output Summary (Last 24 hours) at 08/13/15 0757 Last data filed at 08/13/15 0600  Gross per 24 hour  Intake 481.25 ml  Output    778 ml  Net -296.75 ml      PHYSICAL EXAM  General: Well developed, well nourished, in no acute distress HEENT:  Normocephalic and atramatic Neck:  No JVD.  Lungs: Clear bilaterally to auscultation and percussion. Heart: HRRR . Normal S1 and S2 without gallops or murmurs.  Abdomen: Bowel sounds are positive, abdomen soft and non-tender  Msk:  Back normal, normal gait. Normal strength and tone for age. Extremities: No clubbing, cyanosis or edema.   Neuro: Alert and oriented X 3. Psych:  Good affect, responds appropriately   LABS: Basic Metabolic Panel:  Recent Labs  16/01/9604/15/2017 1930 08/13/15 0323  NA 121* 124*  K 6.9* 5.7*  CL 85* 89*  CO2 16* 19*  GLUCOSE 104* 78  BUN 162* 125*  CREATININE 9.13* 7.79*  CALCIUM 9.4 8.5*  PHOS 10.8* 8.1*   Liver Function Tests:  Recent Labs  08/08/2015 1412 08/23/2015 1930 08/13/15 0323  AST 27  --   --   ALT 26  --   --   ALKPHOS 111  --   --   BILITOT 2.5*  --   --   PROT 6.4*  --   --   ALBUMIN 3.1* 3.0* 2.9*   No results for input(s): LIPASE, AMYLASE in the last 72 hours. CBC:  Recent Labs  08/14/2015 1412 08/13/15 0323  WBC 6.2 5.2  HGB 11.6* 10.7*  HCT 34.6* 31.0*  MCV 96.3 94.5  PLT 108* 103*   Cardiac Enzymes: No results for input(s): CKTOTAL, CKMB, CKMBINDEX, TROPONINI in the last 72 hours. BNP: Invalid input(s): POCBNP D-Dimer: No results for input(s): DDIMER in the last 72 hours. Hemoglobin A1C: No results for input(s): HGBA1C in the last 72 hours. Fasting Lipid Panel: No results for  input(s): CHOL, HDL, LDLCALC, TRIG, CHOLHDL, LDLDIRECT in the last 72 hours. Thyroid Function Tests: No results for input(s): TSH, T4TOTAL, T3FREE, THYROIDAB in the last 72 hours.  Invalid input(s): FREET3 Anemia Panel: No results for input(s): VITAMINB12, FOLATE, FERRITIN, TIBC, IRON, RETICCTPCT in the last 72 hours.  Dg Chest 2 View  09/01/2015  CLINICAL DATA:  history of CAD s/p CABG, CVA with residual right sided weakness, COPD not on home o2, Atrial flutter s/p ablations and cardioversion on eliquis now, CKD stage 3, CHF with systolic dysfunction EF 15% sent in from nephrology office for worsening pedal edema and weight gain in the last 2-3 weeks.He was just in the hospital 2 weeks ago for the same reason and was discharged on increased dose of torsemide after Lasix drip. His discharge weight was 184 pounds. When he went to see his nephrologist today, weight increased up to 207 pounds again. Worsening ascites in the abdomen and also worsening shortness of breath. He does complain of worsening shortness of breath, increasing dyspnea even with short distances and orthopnea. His urine output has been steadily decreasing.So he is being sent to the  hospital for Lasix drip and close monitoring of his renal function. Patient denies any chest pain, nausea vomiting or abdominal pain EXAM: CHEST - 2 VIEW COMPARISON:  07/26/2015 FINDINGS: Stable pleural effusions left greater than right. Adjacent atelectasis or infiltrate in the lung bases left greater than right as before. Heart size upper limits normal. Previous CABG. Left subclavian AICD stable. Mild central pulmonary vascular congestion. No pneumothorax. Visualized skeletal structures are unremarkable. IMPRESSION: 1. Vascular congestion with stable small pleural effusions and bibasilar atelectasis/infiltrate. Electronically Signed   By: Corlis Leak M.D.   On: 08-28-15 15:09     Echo   TELEMETRY: Atrial and ventricular paced:  ASSESSMENT AND  PLAN:  Active Problems:   CHF (congestive heart failure) (HCC)    1. Acute on chronic systolic congestive heart failure, exacerbated by acute renal failure, complicated by hypotension 2. CAD, status post CABG, status post stents, without chest pain 3. Ischemic cardiomyopathy, status post ICD 4. Acute renal failure, with metabolic acidosis and hyperkalemia, initial improvement after urgent dialysis  Recommendations  1. Agree with overall current therapy 2. Add dopamine, initially at renal dose, may titrate up for blood pressure support 3. Up titrate dobutamine to 5, then 7.5 mcg/kg, carefully monitor for arrhythmias 4. Probable repeat dialysis today, pending   Jahzara Slattery, MD, PhD, [facility] 08/13/2015 7:57 AM

## 2015-08-13 NOTE — Consult Note (Signed)
PULMONARY / CRITICAL CARE MEDICINE   Name: Elmo PuttWilliam H Elenes MRN: [id number] DOB: 10/09/1935    ADMISSION DATE:  08/20/2015 CONSULTATION DATE: 08/05/2015  REFERRING MD:  Amado CoeGOURU, A  CHIEF COMPLAINT: Shortness of breath, hypotension  HISTORY OF PRESENT ILLNESS:   Jari PiggWilliam Wisehart is a 80 year old male with a known history of CAD, CVA, restrictive lung disease, A. fib, COPD, chronic kidney disease, diabetes, cardiomyopathy, hypertension, diabetic retinopathy, BPH, atrial flutter status post ablations, CHF exacerbation. He was admitted to the hospital 2 weeks prior to current admission due to CHF exacerbation. Patient [facility] on [facility]8 with worsening  shortness of breath, and significant weight gain, he also noticed that his urine output has been steadily decreasing with his worsening renal function. Upon admission his BUN was 163, CR-9.28, K-7.2, sodium-117. Patient required immediate dialysis and patient is followed by nephrology team. Patient has hypotension and was started on dopamine/dobutamine/Neo-Synephrine for renal protection and BP . Patient remains hypotensive and PCCM team was consulted on the patient on 08/13/15 for further interventions.On 5/9 patient had a few runs of SVT with heart rate up to 140 post dialysis.     PAST MEDICAL HISTORY :  He  has a past medical history of CAD (coronary artery disease); Cerebral vascular accident [facility](HCC); Restrictive lung disease; A-fib (HCC); COPD (chronic obstructive pulmonary disease) (HCC); Chronic kidney disease; Diabetes mellitus without complication (HCC); Type 2 diabetes mellitus with stage 3 chronic kidney disease (HCC); Cardiomyopathy (HCC); Hyperlipidemia; Hypertension; Diabetic retinopathy (HCC); and BPH (benign prostatic hyperplasia).  PAST SURGICAL HISTORY: He  has past surgical history that includes Coronary artery bypass graft; Pacemaker insertion; Vasectomy; Appendectomy; and Cardiac defibrillator  placement.  Allergies  Allergen Reactions  . Heparin Other (See Comments)    Reaction:  Unknown   . Lipitor [Atorvastatin] Other (See Comments)    Reaction:  Memory issues   . Toujeo Solostar [Insulin Glargine] Other (See Comments)    Reaction:  Weight gain     No current facility-administered medications on file prior to encounter.   Current Outpatient Prescriptions on File Prior to Encounter  Medication Sig  . apixaban (ELIQUIS) 2.5 MG TABS tablet Take 2.5 mg by mouth 2 (two) times daily.   Marland Kitchen. aspirin EC 81 MG tablet Take 81 mg by mouth daily.   . B Complex-C (B-COMPLEX WITH VITAMIN C) tablet Take 1 tablet by mouth daily.  . carvedilol (COREG) 3.125 MG tablet Take 3.125 mg by mouth 2 (two) times daily with a meal.  . cholecalciferol (VITAMIN D) 1000 units tablet Take 1,000 Units by mouth daily.  . colesevelam (WELCHOL) 625 MG tablet Take 3 tablets (1,875 mg total) by mouth 2 (two) times daily.  Marland Kitchen. ezetimibe (ZETIA) 10 MG tablet Take 1 tablet (10 mg total) by mouth daily.  . ferrous sulfate 325 (65 FE) MG tablet Take 325 mg by mouth daily with breakfast.   . finasteride (PROSCAR) 5 MG tablet Take 1 tablet (5 mg total) by mouth at bedtime.  . Magnesium 250 MG TABS Take 250 mg by mouth daily.   Marland Kitchen. NOVOLOG FLEXPEN 100 UNIT/ML FlexPen inject 15 units subcutaneously three times a day with meals  . omega-3 fish oil (MAXEPA) 1000 MG CAPS capsule Take 2 capsules by mouth 2 (two) times daily.  . pantoprazole (PROTONIX) 40 MG tablet Take 40 mg by mouth daily.  . pravastatin (PRAVACHOL) 80 MG tablet Take 1 tablet (80 mg total) by mouth at bedtime.  . pyridOXINE (VITAMIN B-6) 50 MG  tablet Take 50 mg by mouth daily.  . saw palmetto 160 MG capsule Take 160 mg by mouth 2 (two) times daily.  Marland Kitchen spironolactone (ALDACTONE) 25 MG tablet Take 12.5 mg by mouth daily.   . tamsulosin (FLOMAX) 0.4 MG CAPS capsule Take 1 capsule (0.4 mg total) by mouth 2 (two) times daily.  Marland Kitchen telmisartan (MICARDIS) 80 MG  tablet Take 40 mg by mouth daily.  Marland Kitchen tiotropium (SPIRIVA) 18 MCG inhalation capsule Place 18 mcg into inhaler and inhale daily.  Marland Kitchen torsemide (DEMADEX) 10 MG tablet Take 3 tablets (30 mg total) by mouth daily. (Patient taking differently: Take 40 mg by mouth daily. )  . vitamin B-12 (CYANOCOBALAMIN) 100 MCG tablet Take 100 mcg by mouth daily.   . vitamin E 400 UNIT capsule Take 400 Units by mouth daily.     FAMILY HISTORY:  His indicated that his mother is deceased. He indicated that his father is deceased.   SOCIAL HISTORY: He  reports that he quit smoking about 46 years ago. His smoking use included Cigarettes, Cigars, and Pipe. He quit smokeless tobacco use about 35 years ago. His smokeless tobacco use included Chew. He reports that he does not drink alcohol or use illicit drugs.  REVIEW OF SYSTEMS:  CONSTITUTIONAL: No fever, fatigue or weakness.  EYES: No blurred or double vision.  EARS, NOSE, AND THROAT: No tinnitus or ear pain.  RESPIRATORY: No cough, reports exertional shortness of breath, denies wheezing or hemoptysis.  CARDIOVASCULAR: No chest pain, orthopnea, edema.  GASTROINTESTINAL: No nausea, vomiting, diarrhea or abdominal pain.  GENITOURINARY: No dysuria, hematuria.  ENDOCRINE: No polyuria, nocturia,  HEMATOLOGY: No anemia, easy bruising or bleeding SKIN: No rash or lesion. MUSCULOSKELETAL: No joint pain or arthritis.  NEUROLOGIC: No tingling, numbness, weakness.  PSYCHIATRY: No anxiety or depression.     SUBJECTIVE:  Patient states that he has been feeling well and he denies any chest pain, palpitations, shortness of breath   VITAL SIGNS: BP 81/64 mmHg  Pulse 96  Temp(Src) 97.4 F (36.3 C) (Axillary)  Resp 17  Ht 5\' 11"  (1.803 m)  Wt 90.22 kg (198 lb 14.4 oz)  BMI 27.75 kg/m2  SpO2 94%  HEMODYNAMICS:    VENTILATOR SETTINGS:    INTAKE / OUTPUT: I/O last 3 completed shifts: In: 484.7 [P.O.:440; I.V.:44.7] Out: 778 [Urine:180;  Other:598]  PHYSICAL EXAMINATION: General:  elderly white, sickly appearing white male Neuro:  Awake, alert, oriented, follows commands HEENT:  atraumatic, normocephalic, no discharge, no JVD appreciated Cardiovascular:  irregular, tachycardic, no MRG noted Lungs: clear bilaterally, no wheezes crackles or rhonchi Abdomen:  Obese, nontender, active bowel sounds Musculoskeletal:  no inflammation or deformity noted Skin: Grossly intact LABS:  BMET  Recent Labs Lab 09-04-2015 1412 09/04/2015 1930 08/13/15 0323  NA 117* 121* 124*  K 7.2* 6.9* 5.7*  CL 83* 85* 89*  CO2 14* 16* 19*  BUN 163* 162* 125*  CREATININE 9.28* 9.13* 7.79*  GLUCOSE 230* 104* 78    Electrolytes  Recent Labs Lab 2015/09/04 1412 09/04/2015 1930 08/13/15 0323  CALCIUM 8.6* 9.4 8.5*  PHOS  --  10.8* 8.1*    CBC  Recent Labs Lab 04-Sep-2015 1412 08/13/15 0323 08/13/15 1239  WBC 6.2 5.2 8.2  HGB 11.6* 10.7* 12.0*  HCT 34.6* 31.0* 35.0*  PLT 108* 103* 124*    Coag's No results for input(s): APTT, INR in the last 168 hours.  Sepsis Markers No results for input(s): LATICACIDVEN, PROCALCITON, O2SATVEN in the last 168 hours.  ABG No results for input(s): PHART, PCO2ART, PO2ART in the last 168 hours.  Liver Enzymes  Recent Labs Lab 08/20/2015 1412 08/25/2015 1930 08/13/15 0323  AST 27  --   --   ALT 26  --   --   ALKPHOS 111  --   --   BILITOT 2.5*  --   --   ALBUMIN 3.1* 3.0* 2.9*    Cardiac Enzymes No results for input(s): TROPONINI, PROBNP in the last 168 hours.  Glucose  Recent Labs Lab 08/09/2015 1629 08/15/2015 1756 08/13/15 1236  GLUCAP 198* 228* 178*    Imaging Dg Chest 2 View  08/24/2015  CLINICAL DATA:  history of CAD s/p CABG, CVA with residual right sided weakness, COPD not on home o2, Atrial flutter s/p ablations and cardioversion on eliquis now, CKD stage 3, CHF with systolic dysfunction EF 15% sent in from nephrology office for worsening pedal edema and weight gain in the last  2-3 weeks.He was just in the hospital 2 weeks ago for the same reason and was discharged on increased dose of torsemide after Lasix drip. His discharge weight was 184 pounds. When he went to see his nephrologist today, weight increased up to 207 pounds again. Worsening ascites in the abdomen and also worsening shortness of breath. He does complain of worsening shortness of breath, increasing dyspnea even with short distances and orthopnea. His urine output has been steadily decreasing.So he is being sent to the hospital for Lasix drip and close monitoring of his renal function. Patient denies any chest pain, nausea vomiting or abdominal pain EXAM: CHEST - 2 VIEW COMPARISON:  07/26/2015 FINDINGS: Stable pleural effusions left greater than right. Adjacent atelectasis or infiltrate in the lung bases left greater than right as before. Heart size upper limits normal. Previous CABG. Left subclavian AICD stable. Mild central pulmonary vascular congestion. No pneumothorax. Visualized skeletal structures are unremarkable. IMPRESSION: 1. Vascular congestion with stable small pleural effusions and bibasilar atelectasis/infiltrate. Electronically Signed   By: Corlis Leak M.D.   On: 09/02/2015 15:09     STUDIES:  4/21 echoLeft ventricle: Systolic function was severely reduced. Theestimated ejection fraction was in the range of 20% to 25%. Diffusehypokinesis.   CULTURES: None  ANTIBIOTICS: None  SIGNIFICANT EVENT None LINES/TUBES: 5/8 right femoral HD catheter  DISCUSSION: 80 year old male with a history of CAD status post CABG, CVA, COPD, atrial flutter status post ablation, chronic kidney disease stage III, now in cardiogenic shock requiring multiple pressors.  ASSESSMENT / PLAN:  PULMONARY A: History of COPD P:   Support with oxygen Keep sats greater than 88%  CARDIOVASCULAR A:    Acute on chronic systolic CHF exacerbation with EF of less than 20% Cardiogenic shock CHF exacerbation Atrial  flutter status post cardioversion and ablation  History of Hypertension,Cardiomyopathy, hyperlipidemia  P:  On dopamine/dobutamine/nor epinephrine Cardiology following Trached I's and O's Continue eliquis for anticoagulation Rest per primary  RENAL A:   Chronic kidney disease stage III Hyperkalemia Hyponatremia hypo-albuminemia anemia  P: Dialysis today   Nephrology following Hold nephrotoxic medication Replace electrolytes per ICU protocol Albumin during dialysis  GASTROINTESTINAL A:   History of GERD P:   On Protonix  HEMATOLOGIC A:   Electrolyte imbalance P:  Replace electrolytes per ICU protocol follow chemistry eliquis for DVT prophylaxis INFECTIOUS A:    No active issues P:   CBC in a.m.  ENDOCRINE A:   Diabetes mellitus P:   Blood sugar checks management per primary team  NEUROLOGIC A:  No active issues P:   RASS goal: 0 Minimize sedation   FAMILY  - Updates:  family was updated by Dr. Belia Heman  - Inter-disciplinary family meet or Palliative Care meeting due by:  5/14  [facility] Varughese,AG-ACNP Pulmonary & Critical Care [facility]   08/13/2015, 1:05 PM  STAFF NOTE: I, Dr. Lucie Leather,  have personally reviewed patient's available data, including medical history, events of note, physical examination and test results as part of my evaluation. I have discussed with NP and other care providers such as pharmacist, RN and RRT.  In addition,  I personally evaluated patient and elicited key findings  Patient with end stage CHF, now with progressive renal failure   A:admitted for cardiogenic shock with cardio-renal syndrome  P:  HD as needed, follow up cardiology recs On dopamine and dobutamine infusion    The Rest per NP whose note is outlined above and that I agree with  I have personally reviewed/obtained a history, examined the patient, evaluated Pertinent laboratory and RadioGraphic/imaging results, and  formulated the  assessment and plan   The Patient requires high complexity decision making for assessment and support, frequent evaluation and titration of therapies, application of advanced monitoring technologies and extensive interpretation of multiple databases. Critical Care Time devoted to patient care services described in this note is 45 minutes.  This Critical care time does not reflrect procedure time or supervisory time of NP but could involve care discussion time Overall, patient is critically ill, prognosis is guarded.  Patient with Multiorgan failure and at high risk for cardiac arrest and death.    Lucie Leather, M.D.  [facility]  Medical Director [facility] Medical Director [facility] Cardio-Pulmonary Department

## 2015-08-13 NOTE — Progress Notes (Signed)
Treatment initiation assessment 

## 2015-08-13 NOTE — Progress Notes (Signed)
Initial Nutrition Assessment     INTERVENTION:  Monitor intake and cater to pt preferences within diet restriction If will continue on HD as outpatient will provide diet education on follow-up. Recommend adding nepro BID for added nutrition   NUTRITION DIAGNOSIS:   Inadequate oral intake related to acute illness as evidenced by per patient/family report.    GOAL:   Patient will meet greater than or equal to 90% of their needs    MONITOR:   PO intake, Labs, Supplement acceptance  REASON FOR ASSESSMENT:   Consult Poor PO  ASSESSMENT:   80 y/o male admitted with CHF, ARF, hypotension, requiring emergent HD  Past Medical History  Diagnosis Date  . CAD (coronary artery disease)     s/p CABG  . Cerebral vascular accident [facility](HCC)     with residual right sided weakness  . Restrictive lung disease     Not on home o2  . A-fib [facility](HCC)     s/p ablation  . COPD (chronic obstructive pulmonary disease) (HCC)   . Chronic kidney disease     stage 3, GFR 31 at baseline  . Diabetes mellitus without complication (HCC)   . Type 2 diabetes mellitus with stage 3 chronic kidney disease (HCC)   . Cardiomyopathy (HCC)     Last EF 15% in Nov 2016  . Hyperlipidemia   . Hypertension   . Diabetic retinopathy (HCC)   . BPH (benign prostatic hyperplasia)      Pt reports appetite has been decreased for the past month, no taste for food.  Reports eating less than 50% of normal intake. Ate only few bites of breakfast this am  Medications reviewed: colace, aspart, Mag ox, MVI, renvela, omega 3, vit B6, vit E, dobutamine drip, dopamine drip, levophed, miralax  Labs reviewed Na 129, K 5.2, BUN 97, creatinine 6.30, glucose 137, phosphorus 6.2, Mag 2.9  Diet Order:  Diet heart healthy/carb modified Room service appropriate?: Yes; Fluid consistency:: Thin  Skin:  Reviewed, no issues  Last BM:  5/8  Height:   Ht Readings from Last 1 Encounters:  08/06/2015 5\' 11"  (1.803 m)    Weight:    Wt Readings from Last 1 Encounters:  08/31/2015 198 lb 14.4 oz (90.22 kg)    Ideal Body Weight:     BMI:  Body mass index is 27.75 kg/(m^2).  Estimated Nutritional Needs:   Kcal:  1950-2340 kcals/d (using IBW of 78kg)  Protein:  98-117 g/d  Fluid:  1000ml + UOP  EDUCATION NEEDS:   Education needs no appropriate at this time  [place] Zarazua B. Freida BusmanAllen, RD, LDN [phone] (pager) Weekend/On-Call pager [phone])

## 2015-08-13 NOTE — Progress Notes (Signed)
Inpatient Diabetes Program Recommendations  AACE/ADA: New Consensus Statement on Inpatient Glycemic Control (2015)  Target Ranges:  Prepandial:   less than 140 mg/dL      Peak postprandial:   less than 180 mg/dL (1-2 hours)      Critically ill patients:  140 - 180 mg/dL  Results for Matthew [name], Charels H (MRN [id number]) as of 08/13/2015 10:58  Ref. Range 08/13/2015 03:23  Glucose Latest Ref Range: 65-99 mg/dL 78   Results for Matthew [name], Dell H (MRN [id number]) as of 08/13/2015 10:58  Ref. Range 08/29/2015 16:29 08/20/2015 17:56  Glucose-Capillary Latest Ref Range: 65-99 mg/dL 782198 (H) 956228 (H)   Review of Glycemic Control  Diabetes history: DM2 Outpatient Diabetes medications: Novolog 15 units TID with meals Current orders for Inpatient glycemic control: Novolog 10 units TID with meals  Inpatient Diabetes Program Recommendations: Correction (SSI): Please discontinue Novolog 10 units TID with meals and consider ordering CBGs with Novolog moderate correction scale ACHS. HgbA1C: Please consider ordering an A1C to evaluate glycemic control over the past 2-3 months.  Thanks, Orlando PennerMarie Angelic Schnelle, RN, MSN, CDE Diabetes Coordinator Inpatient Diabetes Program [phone] (Team Pager from 8am to 5pm) [phone] ([facility] office) [phone] [facility] office) [phone] [facility] office)

## 2015-08-13 NOTE — Plan of Care (Signed)
Problem: Physical Regulation: Goal: Ability to maintain clinical measurements within normal limits will improve Outcome: Progressing Labs improving with HD. Took 1.5 liters off today.  After HD pt had episode of hypotension with Afib RVR  and short runs vtach. Levophed was added and dopamine gtt decreased to 3mcg. Dobutamine continues at 2.905mcg.  Uop 170 ml tea colored cloudy. Goal: Will remain free from infection Outcome: Progressing No fevers.  WBC 5.2.  Problem: Tissue Perfusion: Goal: Risk factors for ineffective tissue perfusion will decrease Outcome: Progressing On eliquis as at home.  Heparin allergy. Teds and scd's  Problem: Fluid Volume: Goal: Ability to maintain a balanced intake and output will improve Outcome: Progressing HD removed 500ml yesterday and 1.5 liter today. 1 small albumin given during HD today. Recieved 250ml NS bolus at 12:30 for hypotension post HD. UOP 170ml for 12 hours  Problem: Activity: Goal: Capacity to carry out activities will improve Outcome: Progressing [facility]. Turns self in bed.  Problem: Cardiac: Goal: Ability to achieve and maintain adequate cardiopulmonary perfusion will improve Outcome: Progressing See above notes  Problem: Education: Goal: Ability to verbalize understanding of medication therapies will improve Outcome: Progressing Action and purpose of dobutamine, dopamine and levophed discussed. HD purpose and results /complications discussed

## 2015-08-13 NOTE — Care Management Note (Signed)
I will monitor patient for possible need for outpatient dialysis needs.  I am gathering medical records as they become available.  Selena BattenKim Filbert Craze Dialysis Coordinator   [phone]

## 2015-08-14 DIAGNOSIS — I5021 Acute systolic (congestive) heart failure: Secondary | ICD-10-CM

## 2015-08-14 DIAGNOSIS — I959 Hypotension, unspecified: Secondary | ICD-10-CM

## 2015-08-14 LAB — BASIC METABOLIC PANEL
ANION GAP: 13 (ref 5–15)
BUN: 102 mg/dL — ABNORMAL HIGH (ref 6–20)
CALCIUM: 8.8 mg/dL — AB (ref 8.9–10.3)
CHLORIDE: 88 mmol/L — AB (ref 101–111)
CO2: 23 mmol/L (ref 22–32)
Creatinine, Ser: 6.71 mg/dL — ABNORMAL HIGH (ref 0.61–1.24)
GFR calc non Af Amer: 7 mL/min — ABNORMAL LOW (ref >60)
GFR, EST AFRICAN AMERICAN: 8 mL/min — AB (ref >60)
Glucose, Bld: 223 mg/dL — ABNORMAL HIGH (ref 65–99)
Potassium: 5.5 mmol/L — ABNORMAL HIGH (ref 3.5–5.1)
SODIUM: 124 mmol/L — AB (ref 135–145)

## 2015-08-14 LAB — HEPATITIS B CORE ANTIBODY, TOTAL: HEP B C TOTAL AB: NEGATIVE

## 2015-08-14 LAB — CBC
HCT: 32.1 % — ABNORMAL LOW (ref 40.0–52.0)
HEMOGLOBIN: 10.9 g/dL — AB (ref 13.0–18.0)
MCH: 31.9 pg (ref 26.0–34.0)
MCHC: 34 g/dL (ref 32.0–36.0)
MCV: 93.8 fL (ref 80.0–100.0)
Platelets: 140 10*3/uL — ABNORMAL LOW (ref 150–440)
RBC: 3.42 MIL/uL — AB (ref 4.40–5.90)
RDW: 15.6 % — AB (ref 11.5–14.5)
WBC: 7.8 10*3/uL (ref 3.8–10.6)

## 2015-08-14 LAB — HEPATITIS B SURFACE ANTIBODY,QUALITATIVE: Hep B S Ab: NONREACTIVE

## 2015-08-14 LAB — GLUCOSE, CAPILLARY
GLUCOSE-CAPILLARY: 261 mg/dL — AB (ref 65–99)
Glucose-Capillary: 225 mg/dL — ABNORMAL HIGH (ref 65–99)
Glucose-Capillary: 251 mg/dL — ABNORMAL HIGH (ref 65–99)

## 2015-08-14 LAB — HEMOGLOBIN A1C: Hgb A1c MFr Bld: 7 % — ABNORMAL HIGH (ref 4.0–6.0)

## 2015-08-14 LAB — HEPATITIS B SURFACE ANTIGEN: HEP B S AG: NEGATIVE

## 2015-08-14 MED ORDER — MORPHINE SULFATE (PF) 2 MG/ML IV SOLN
2.0000 mg | INTRAVENOUS | Status: DC | PRN
Start: 2015-08-14 — End: 2015-08-15

## 2015-08-14 MED ORDER — INSULIN GLARGINE 100 UNIT/ML ~~LOC~~ SOLN
5.0000 [IU] | Freq: Every day | SUBCUTANEOUS | Status: DC
  Administered 2015-08-14: 5 [IU] via SUBCUTANEOUS
  Filled 2015-08-14: qty 0.05

## 2015-08-14 MED ORDER — BISACODYL 10 MG RE SUPP: 10.0000 mg | Freq: Every day | RECTAL | Status: DC | PRN

## 2015-08-14 MED ORDER — LORAZEPAM 2 MG/ML IJ SOLN: 0.5000 mg | Freq: Every evening | INTRAMUSCULAR | Status: DC | PRN

## 2015-08-14 MED ORDER — MORPHINE SULFATE (PF) 2 MG/ML IV SOLN: 1.0000 mg | INTRAVENOUS | Status: DC | PRN

## 2015-08-14 MED ORDER — MIDODRINE HCL 5 MG PO TABS
10.0000 mg | ORAL_TABLET | Freq: Three times a day (TID) | ORAL | Status: DC
  Administered 2015-08-14 – 2015-08-15 (×3): 10 mg via ORAL
  Filled 2015-08-14 (×3): qty 2

## 2015-08-14 MED ORDER — PROCHLORPERAZINE 25 MG RE SUPP
25.0000 mg | Freq: Three times a day (TID) | RECTAL | Status: DC | PRN
  Filled 2015-08-14: qty 1

## 2015-08-14 NOTE — Plan of Care (Signed)
Problem: Physical Regulation: Goal: Ability to maintain clinical measurements within normal limits will improve Outcome: Not Progressing Patient modified comfort care patient. Infusions continued per MD order (plan to discontinue tomorrow at 11:00). [employer] will come to turn off ICD but not pacemaker per MD tomorrow around 0800 to 0900. Patient resting comfortably. Femoral trialysis catheter continues to ooze blood. Pt declined interventions for constipation.

## 2015-08-14 NOTE — Progress Notes (Signed)
[facility]  ROUNDING NOTE   Subjective:   Daughter at bedside.  Seemed to have VT after dialysis treatment yesterday. However no one notified the physician. Now patient is on norepinephrine, dopamine and dobutamine. UF 1.5 litres  UOP 560  Objective:  Vital signs in last 24 hours:  Temp:  [97.4 F (36.3 C)-99 F (37.2 C)] 99 F (37.2 C) (05/10 0813) Pulse Rate:  [70-118] 75 (05/10 0813) Resp:  [13-24] 20 (05/10 0813) BP: (62-108)/(48-74) 98/59 mmHg (05/10 0800) SpO2:  [93 %-100 %] 96 % (05/10 0813)  Weight change:  Filed Weights   08/09/2015 1355  Weight: 90.22 kg (198 lb 14.4 oz)    Intake/Output: I/O last 3 completed shifts: In: 1537.8 [P.O.:680; I.V.:557.8; IV Piggyback:300] Out: 2838 [Urine:740; WPVXY:8016]   Intake/Output this shift:  Total I/O In: 4.4 [I.V.:4.4] Out: -   Physical Exam: General: Critically ill appearing  Head: Normocephalic, atraumatic. Moist oral mucosal membranes  Eyes: Anicteric, PERRL  Neck: supple  Lungs:  Bibasilar crackles  Heart: Paced rhythm  Abdomen:  +distended  Extremities: trace peripheral edema.  Neurologic: Nonfocal, moving all four extremities  Skin: No lesions  Access: Right femoral temp HD catheter Dr. Lucky Cowboy 5/8    Basic Metabolic Panel:  Recent Labs Lab 08/17/2015 1412 08/22/2015 1930 08/13/15 0323 08/13/15 1239 08/14/15 0342  NA 117* 121* 124* 129* 124*  K 7.2* 6.9* 5.7* 5.2* 5.5*  CL 83* 85* 89* 91* 88*  CO2 14* 16* 19* 21* 23  GLUCOSE 230* 104* 78 157* 223*  BUN 163* 162* 125* 97* 102*  CREATININE 9.28* 9.13* 7.79* 6.30* 6.71*  CALCIUM 8.6* 9.4 8.5* 8.9 8.8*  MG  --   --   --  2.9*  --   PHOS  --  10.8* 8.1* 6.2*  --     Liver Function Tests:  Recent Labs Lab 08/25/2015 1412 08/13/2015 1930 08/13/15 0323  AST 27  --   --   ALT 26  --   --   ALKPHOS 111  --   --   BILITOT 2.5*  --   --   PROT 6.4*  --   --   ALBUMIN 3.1* 3.0* 2.9*   No results for input(s): LIPASE, AMYLASE in the last  168 hours. No results for input(s): AMMONIA in the last 168 hours.  CBC:  Recent Labs Lab 08/31/2015 1412 08/13/15 0323 08/13/15 1239 08/14/15 0342  WBC 6.2 5.2 8.2 7.8  HGB 11.6* 10.7* 12.0* 10.9*  HCT 34.6* 31.0* 35.0* 32.1*  MCV 96.3 94.5 94.9 93.8  PLT 108* 103* 124* 140*    Cardiac Enzymes:  Recent Labs Lab 08/13/15 1239  TROPONINI 0.10*    BNP: Invalid input(s): POCBNP  CBG:  Recent Labs Lab 08/31/2015 1756 08/13/15 1236 08/13/15 1733 08/13/15 2241 08/14/15 0802  GLUCAP 228* 178* 174* 237* 225*    Microbiology: Results for orders placed or performed during the hospital encounter of 09/04/2015  MRSA PCR Screening     Status: None   Collection Time: 08/13/15  1:35 AM  Result Value Ref Range Status   MRSA by PCR NEGATIVE NEGATIVE Final    Comment:        The GeneXpert MRSA Assay (FDA approved for NASAL specimens only), is one component of a comprehensive MRSA colonization surveillance program. It is not intended to diagnose MRSA infection nor to guide or monitor treatment for MRSA infections.     Coagulation Studies: No results for input(s): LABPROT, INR in the last 72  hours.  Urinalysis:  Recent Labs  09/02/2015 1707  Craig 1.013  PHURINE 5.0  GLUCOSEU NEGATIVE  HGBUR NEGATIVE  BILIRUBINUR NEGATIVE  KETONESUR NEGATIVE  PROTEINUR 100*  NITRITE NEGATIVE  LEUKOCYTESUR NEGATIVE      Imaging: Dg Chest 2 View  09/02/2015  CLINICAL DATA:  history of CAD s/p CABG, CVA with residual right sided weakness, COPD not on home o2, Atrial flutter s/p ablations and cardioversion on eliquis now, CKD stage 3, CHF with systolic dysfunction EF 98% sent in from nephrology office for worsening pedal edema and weight gain in the last 2-3 weeks.He was just in the hospital 2 weeks ago for the same reason and was discharged on increased dose of torsemide after Lasix drip. His discharge weight was 184 pounds. When he went to see his nephrologist  today, weight increased up to 207 pounds again. Worsening ascites in the abdomen and also worsening shortness of breath. He does complain of worsening shortness of breath, increasing dyspnea even with short distances and orthopnea. His urine output has been steadily decreasing.So he is being sent to the hospital for Lasix drip and close monitoring of his renal function. Patient denies any chest pain, nausea vomiting or abdominal pain EXAM: CHEST - 2 VIEW COMPARISON:  07/26/2015 FINDINGS: Stable pleural effusions left greater than right. Adjacent atelectasis or infiltrate in the lung bases left greater than right as before. Heart size upper limits normal. Previous CABG. Left subclavian AICD stable. Mild central pulmonary vascular congestion. No pneumothorax. Visualized skeletal structures are unremarkable. IMPRESSION: 1. Vascular congestion with stable small pleural effusions and bibasilar atelectasis/infiltrate. Electronically Signed   By: Lucrezia Europe M.D.   On: 08/22/2015 15:09     Medications:   . DOBUTamine 5 mcg/kg/min (08/14/15 0817)  . DOPamine 3 mcg/kg/min (08/14/15 0700)  . norepinephrine (LEVOPHED) Adult infusion 10 mcg/min (08/14/15 0700)   . antiseptic oral rinse  7 mL Mouth Rinse BID  . apixaban  2.5 mg Oral BID  . aspirin EC  81 mg Oral Daily  . cholecalciferol  1,000 Units Oral Daily  . docusate sodium  100 mg Oral BID  . ezetimibe  10 mg Oral Daily  . feeding supplement (NEPRO CARB STEADY)  237 mL Oral BID BM  . ferrous sulfate  325 mg Oral Q breakfast  . finasteride  5 mg Oral QHS  . insulin aspart  0-15 Units Subcutaneous TID WC  . insulin aspart  0-5 Units Subcutaneous QHS  . magnesium oxide  400 mg Oral Daily  . multivitamin with minerals  1 tablet Oral Daily  . omega-3 acid ethyl esters  2 capsule Oral BID  . pantoprazole  40 mg Oral Daily  . pravastatin  80 mg Oral QHS  . pyridOXINE  50 mg Oral Daily  . sevelamer carbonate  800 mg Oral TID WC  . sodium chloride flush   3 mL Intravenous Q12H  . tamsulosin  0.4 mg Oral BID  . tiotropium  18 mcg Inhalation Daily  . vitamin B-12  100 mcg Oral Daily  . vitamin E  400 Units Oral Daily   acetaminophen **OR** acetaminophen, anticoagulant sodium citrate, ondansetron **OR** ondansetron (ZOFRAN) IV, polyethylene glycol  Assessment/ Plan:  [name] is a 80 y.o. white male with coronary artery disease status post CABG, CHF, hypertension, hyperlipidemia, COPD, diabetes mellitus, history of paroxysmal atrial fibrillation, BPH, ablation for atrial flutter, who was admitted on 08/15/2015 for Shortness of Breath Edema CKD  1. Acute  renal failure with hyperkalemia and metabolic acidosis on chronic kidney disease stage IV: baseline creatinine of 2.19, eGFR of 27 on 07/30/15.  BUN and potassium still critically elevated. Potassium 5.5.  Acute renal failure secondary to acute cardiorenal syndrome and/or over diuresis. Concern for limited recovery. Hyperkalemia despite being on loop diuretic and low potassium diet. Some concern for obstructive uropathy. Foley catheter placed.  - Will discuss hemodialysis treatment with family and Critical Care Physician before restarting treatment. May need LVAD or CRRT.   - Evaluate daily for dialysis need.   2. Hypertension and Acute systolic congestive heart failure: failed outpatient diuretic therapy and now with hypotension requiring dobutamine gtt and dopamine gtt.  - hold diuretics due to renal falure - Appreciate cardiology input.   3. Secondary hyperparathyroidism of renal origin: hyperphosphatemia - not currently on binder: start renvela.   4. Anemia of chronic kidney disease: no indication to epo.    LOS: 2 Matthew [name] 5/10/20178:54 AM

## 2015-08-14 NOTE — Progress Notes (Signed)
Inpatient Diabetes Program Recommendations  AACE/ADA: New Consensus Statement on Inpatient Glycemic Control (2015)  Target Ranges:  Prepandial:   less than 140 mg/dL      Peak postprandial:   less than 180 mg/dL (1-2 hours)      Critically ill patients:  140 - 180 mg/dL  Results for Elmo PuttMALPASS, Sinan H (MRN [id number]) as of 08/14/2015 10:32  Ref. Range 08/13/2015 12:36 08/13/2015 17:33 08/13/2015 22:41 08/14/2015 08:02  Glucose-Capillary Latest Ref Range: 65-99 mg/dL 409178 (H) 811174 (H) 914237 (H) 225 (H)   Review of Glycemic Control  Outpatient Diabetes medications: Novolog 15 units TID with meals Current orders for Inpatient glycemic control: Novolog 0-15 units TID with meals, Novolog 0-5 units QHS  Inpatient Diabetes Program Recommendations: Insulin - Basal: Fasting glucose 225 mg/dl. Please consider ordering low dose basal insulin. Recommend starting with Lantus 5 units Q24H starting now.  Thanks, Orlando PennerMarie Wilburta Milbourn, RN, MSN, CDE Diabetes Coordinator Inpatient Diabetes Program [phone] (Team Pager from 8am to 5pm) [phone] ([facility] office) [phone] [facility] office) [phone] [facility] office)

## 2015-08-14 NOTE — Progress Notes (Signed)
[facility]  SUBJECTIVE: I'm ready to check out   Filed Vitals:   08/14/15 0300 08/14/15 0400 08/14/15 0500 08/14/15 0600  BP: 96/56 88/52 97/62  95/49  Pulse: 78 70 76 74  Temp:  98.3 F (36.8 C)    TempSrc:  Oral    Resp: 16 22 14 14   Height:      Weight:      SpO2: 94% 98% 99% 100%     Intake/Output Summary (Last 24 hours) at 08/14/15 0740 Last data filed at 08/14/15 0600  Gross per 24 hour  Intake 1278.58 ml  Output   2060 ml  Net -781.42 ml      PHYSICAL EXAM  General: Well developed, well nourished, in no acute distress HEENT:  Normocephalic and atramatic Neck:  No JVD.  Lungs: Clear bilaterally to auscultation and percussion. Heart: HRRR . Normal S1 and S2 without gallops or murmurs.  Abdomen: Bowel sounds are positive, abdomen soft and non-tender  Msk:  Back normal, normal gait. Normal strength and tone for age. Extremities: No clubbing, cyanosis or edema.   Neuro: Alert and oriented X 3. Psych:  Good affect, responds appropriately   LABS: Basic Metabolic Panel:  Recent Labs  16/01/9604/09/17 0323 08/13/15 1239 08/14/15 0342  NA 124* 129* 124*  K 5.7* 5.2* 5.5*  CL 89* 91* 88*  CO2 19* 21* 23  GLUCOSE 78 157* 223*  BUN 125* 97* 102*  CREATININE 7.79* 6.30* 6.71*  CALCIUM 8.5* 8.9 8.8*  MG  --  2.9*  --   PHOS 8.1* 6.2*  --    Liver Function Tests:  Recent Labs  08/15/2015 1412 08/14/2015 1930 08/13/15 0323  AST 27  --   --   ALT 26  --   --   ALKPHOS 111  --   --   BILITOT 2.5*  --   --   PROT 6.4*  --   --   ALBUMIN 3.1* 3.0* 2.9*   No results for input(s): LIPASE, AMYLASE in the last 72 hours. CBC:  Recent Labs  08/13/15 1239 08/14/15 0342  WBC 8.2 7.8  HGB 12.0* 10.9*  HCT 35.0* 32.1*  MCV 94.9 93.8  PLT 124* 140*   Cardiac Enzymes:  Recent Labs  08/13/15 1239  TROPONINI 0.10*   BNP: Invalid input(s): POCBNP D-Dimer: No results for input(s): DDIMER in the last 72 hours. Hemoglobin A1C: No results for input(s): HGBA1C  in the last 72 hours. Fasting Lipid Panel: No results for input(s): CHOL, HDL, LDLCALC, TRIG, CHOLHDL, LDLDIRECT in the last 72 hours. Thyroid Function Tests: No results for input(s): TSH, T4TOTAL, T3FREE, THYROIDAB in the last 72 hours.  Invalid input(s): FREET3 Anemia Panel: No results for input(s): VITAMINB12, FOLATE, FERRITIN, TIBC, IRON, RETICCTPCT in the last 72 hours.  Dg Chest 2 View  08/11/2015  CLINICAL DATA:  history of CAD s/p CABG, CVA with residual right sided weakness, COPD not on home o2, Atrial flutter s/p ablations and cardioversion on eliquis now, CKD stage 3, CHF with systolic dysfunction EF 15% sent in from nephrology office for worsening pedal edema and weight gain in the last 2-3 weeks.He was just in the hospital 2 weeks ago for the same reason and was discharged on increased dose of torsemide after Lasix drip. His discharge weight was 184 pounds. When he went to see his nephrologist today, weight increased up to 207 pounds again. Worsening ascites in the abdomen and also worsening shortness of breath. He does complain of worsening shortness of  breath, increasing dyspnea even with short distances and orthopnea. His urine output has been steadily decreasing.So he is being sent to the hospital for Lasix drip and close monitoring of his renal function. Patient denies any chest pain, nausea vomiting or abdominal pain EXAM: CHEST - 2 VIEW COMPARISON:  07/26/2015 FINDINGS: Stable pleural effusions left greater than right. Adjacent atelectasis or infiltrate in the lung bases left greater than right as before. Heart size upper limits normal. Previous CABG. Left subclavian AICD stable. Mild central pulmonary vascular congestion. No pneumothorax. Visualized skeletal structures are unremarkable. IMPRESSION: 1. Vascular congestion with stable small pleural effusions and bibasilar atelectasis/infiltrate. Electronically Signed   By: Corlis Leak M.D.   On: 08/31/15 15:09     Echo    TELEMETRY: Atrial and ventricular pacing:  ASSESSMENT AND PLAN:  Active Problems:   CHF (congestive heart failure) (HCC)    1. Cardiorenal syndrome 2. Acute on chronic systolic congestive heart failure, complicated by acute renal failure and hypotension 3. Hypertension, poor cardiac output 4. Ischemic cardiomyopathy, status post ICD 5. Acute renal failure with metabolic acidosis and hyperkalemia, improved after dialysis 6. Atrial fibrillation, on Eliquis for stroke prevention  Recommendations  1. Agree with current therapy 2. Up titrate dobutamine 5 mcg/kg 3. Attempt slow weaning of Levothroid drip 4. Await palliative care consult   Codey Burling, MD, PhD, [facility] 08/14/2015 7:40 AM

## 2015-08-14 NOTE — Progress Notes (Signed)
Palliative Care Update  1.  I have confirmed with Andrey CampanileSandy, rep with [employer], that she will be here tomorrow am around 8:30 or 9 am to [facility] (while leaving the pacer functional).  2.  I see reference to 'possible hospice consult' in  notes. There cannot be a hospice consult unless pt already has a discharge plan to leave this hospital building. He is expected to drop his BP to a degree that he may pass away quickly when we withdraw the pressors tomorrow at 11 am.   Since there is no inpatient hospice (NO 'GIP' program) any longer, a hospice consult is not pertinent.  Should pt survive the withdrawal of the pressor agents and should he then linger we would first transfer him to 1c and THEN --only if pt is appropriate and stable enough to survive an ambulance ride, consider discussion re: a possible hospice consult (by going through social worker if considering hospice home --to be sure that 'choice' is presented etc).   This note is entered simply to help clarify how and when we might obtain a consult regarding [facility] or in a SNF or at pt's home, etc.    Cammie McgeeMargaret Ferriter Smith Potenza, MD

## 2015-08-14 NOTE — Care Management (Signed)
Readmit for CHF. On previous admission, patient and wife reported patient was independent, driving and active. No home health set up at that time. PCP Dr. Dossie Arbourrissman ESRD on dialysis now, dialysis coordinator aware and following.  ES CHF now with cardiogenic shock requiring dopamine, dobutamine and levo. DNR. Palliative care consult for goals of care. Possible hospice consult discussed during progression. ICU attending to speak with family Will assist as needed. Following progression.

## 2015-08-14 NOTE — Progress Notes (Signed)
Chaplain rounded the unit with Doctor and provided a compassionate presence and support to the family during and after Doctor's consultation. Family stated that patient is depressed with health changes. Jefm PettyChaplain Elvi Leventhal [phone]

## 2015-08-14 NOTE — Progress Notes (Signed)
Clarified with Dr. Orvan Falconerampbell about order that came through to remove blood pressure cuff and cardiac monitoring. MD discontinued order, as patient is staying on vasoactive medications until tomorrow. Tomorrow at 11 am when vasoactive medications discontinued then patient will have that equipment removed. Removed TEDS/SCDs from patient per MD and patient. Also informed Leta JunglingMarcia, RN at [facility] about patient's modified comfort care status.

## 2015-08-14 NOTE — Progress Notes (Addendum)
[facility] Progess Note    ASSESSMENT/PLAN     DISCUSSION: 80 year old male with a history of CAD status post CABG, CVA, COPD, atrial flutter status post ablation, chronic kidney disease stage III, now in cardiogenic shock requiring multiple pressors. CAD, S/P CABG x 3, stroke, ischemic cardiomyopathy, s/p [employer] ICD device implantation on 12/31/2005, and history of atrial fibrillation & atrial flutter, s/p cavotriscupid isthmus ablation on 10/21/2012 by Dr. Jean Rosenthal. He is status post atrial fibrillation as well as focal/microreentrant atrial tachycardia catheter ablation on 07/21/2013 by Dr. Sedalia Muta, status post DCCV 02/11/2016.   Addendum: The patient's case was discussed with his family, including patient's wife and daughter. They appear to understand the patient's become increasingly depressed and that his clinical status has declined. They related that his wishes are that he would not want to be kept alive, nor would he want to be transferred to another institution to undergo further interventions. He would prefer to die at home. I explained that he is currently on 3 pressors so it would be unlikely that he could be discharged home to pass away. They're going to call other family members to come and see him, palliative care has been consulted and they will discuss further measures with the palliative care service. Meanwhile the patient has been made DO NOT RESUSCITATE, we'll start him on Midodrine to see if this helps wean off the pressors.    ASSESSMENT / PLAN:  PULMONARY A: History of COPD P:  Support with oxygen Keep sats greater than 88%  CARDIOVASCULAR A:  Acute on chronic systolic CHF exacerbation with EF of less 20% Cardiogenic shock CHF exacerbation Atrial flutter status post cardioversion and ablation History of Hypertension,Cardiomyopathy, hyperlipidemia  P:  On dopamine/dobutamine/nor epinephrine;  Will discuss with patient  and family today/patient may require transfer to tertiary center; he may require an infusion or LVAD. If not he may need to be transitioned to hospice.  Cardiology following Continue eliquis for anticoagulation   RENAL A:  Acute renal failure on chronic kidney disease. Hyperkalemia Hyponatremia hypo-albuminemia anemia  P: Dw nephrology, the patient will likely require CRRT, as he does not appear to be tolerating hemodialysis. Nephrology following Hold nephrotoxic medication Replace electrolytes per ICU protocol   GASTROINTESTINAL A:  History of GERD P:  On Protonix  HEMATOLOGIC A:  Electrolyte imbalance P:  Replace electrolytes per ICU protocol follow chemistry eliquis for DVT prophylaxis INFECTIOUS A:  No active issues P:  CBC in a.m.  ENDOCRINE A:  Diabetes mellitus P:  Blood sugar checks management per primary team  NEUROLOGIC A:  No active issues P:  RASS goal: 0 Minimize sedation  Best Practices  DVT Prophylaxis: On apixaban. GI Prophylaxis: on protonix   ---------------------------------------   ----------------------------------------   Name: Matthew [name] MRN: [id number] DOB: 1936/03/25    ADMISSION DATE:  08/17/2015     SUBJECTIVE:   The patient is awake, alert, he feels mildly short of breath, he has no other complaints today.  Review of Systems:  Constitutional: Feels well. Cardiovascular: No chest pain.  Pulmonary: Denies dyspnea.   The remainder of systems were reviewed and were found to be negative other than what is documented in the HPI.    VITAL SIGNS: Temp:  [97.4 F (36.3 C)-99 F (37.2 C)] 99 F (37.2 C) (05/10 0813) Pulse Rate:  [70-118] 88 (05/10 0900) Resp:  [13-24] 18 (05/10 0900) BP: (62-108)/(48-74) 91/54 mmHg (05/10 0900) SpO2:  [93 %-100 %] 98 % (  05/10 0900) HEMODYNAMICS:   VENTILATOR SETTINGS:   INTAKE / OUTPUT:  Intake/Output Summary (Last 24 hours) at 08/14/15  0915 Last data filed at 08/14/15 0817  Gross per 24 hour  Intake 1169.7 ml  Output   2060 ml  Net -890.3 ml    PHYSICAL EXAMINATION: Physical Examination:   VS: BP 91/54 mmHg  Pulse 88  Temp(Src) 99 F (37.2 C) (Oral)  Resp 18  Ht 5\' 11"  (1.803 m)  Wt 198 lb 14.4 oz (90.22 kg)  BMI 27.75 kg/m2  SpO2 98%  General Appearance: No distress , Appears fatigued, lethargic. Neuro:without focal findings, mental status , reduced HEENT: PERRLA, EOM intact. Pulmonary: normal breath sounds   CardiovascularNormal S1,S2.  No m/r/g.   Abdomen: Benign, Soft, non-tender. Renal:  No costovertebral tenderness  GU:  Not performed at this time. Endocrine: No evident thyromegaly. Skin:   warm, no rashes, no ecchymosis  Extremities: normal, no cyanosis, clubbing.   LABS:   LABORATORY PANEL:   CBC  Recent Labs Lab 08/14/15 0342  WBC 7.8  HGB 10.9*  HCT 32.1*  PLT 140*    Chemistries   Recent Labs Lab 08/28/2015 1412  08/13/15 1239 08/14/15 0342  NA 117*  < > 129* 124*  K 7.2*  < > 5.2* 5.5*  CL 83*  < > 91* 88*  CO2 14*  < > 21* 23  GLUCOSE 230*  < > 157* 223*  BUN 163*  < > 97* 102*  CREATININE 9.28*  < > 6.30* 6.71*  CALCIUM 8.6*  < > 8.9 8.8*  MG  --   --  2.9*  --   PHOS  --   < > 6.2*  --   AST 27  --   --   --   ALT 26  --   --   --   ALKPHOS 111  --   --   --   BILITOT 2.5*  --   --   --   < > = values in this interval not displayed.   Recent Labs Lab 08/19/2015 1629 08/09/2015 1756 08/13/15 1236 08/13/15 1733 08/13/15 2241 08/14/15 0802  GLUCAP 198* 228* 178* 174* 237* 225*   No results for input(s): PHART, PCO2ART, PO2ART in the last 168 hours.  Recent Labs Lab 08/18/2015 1412 08/20/2015 1930 08/13/15 0323  AST 27  --   --   ALT 26  --   --   ALKPHOS 111  --   --   BILITOT 2.5*  --   --   ALBUMIN 3.1* 3.0* 2.9*    Cardiac Enzymes  Recent Labs Lab 08/13/15 1239  TROPONINI 0.10*    RADIOLOGY:  Dg Chest 2 View  09/04/2015  CLINICAL DATA:   history of CAD s/p CABG, CVA with residual right sided weakness, COPD not on home o2, Atrial flutter s/p ablations and cardioversion on eliquis now, CKD stage 3, CHF with systolic dysfunction EF 15% sent in from nephrology office for worsening pedal edema and weight gain in the last 2-3 weeks.He was just in the hospital 2 weeks ago for the same reason and was discharged on increased dose of torsemide after Lasix drip. His discharge weight was 184 pounds. When he went to see his nephrologist today, weight increased up to 207 pounds again. Worsening ascites in the abdomen and also worsening shortness of breath. He does complain of worsening shortness of breath, increasing dyspnea even with short distances and orthopnea. His urine output has been steadily  decreasing.So he is being sent to the hospital for Lasix drip and close monitoring of his renal function. Patient denies any chest pain, nausea vomiting or abdominal pain EXAM: CHEST - 2 VIEW COMPARISON:  07/26/2015 FINDINGS: Stable pleural effusions left greater than right. Adjacent atelectasis or infiltrate in the lung bases left greater than right as before. Heart size upper limits normal. Previous CABG. Left subclavian AICD stable. Mild central pulmonary vascular congestion. No pneumothorax. Visualized skeletal structures are unremarkable. IMPRESSION: 1. Vascular congestion with stable small pleural effusions and bibasilar atelectasis/infiltrate. Electronically Signed   By: Corlis Leak  Hassell M.D.   On: 2015/05/05 15:09       --Wells Guileseep Danniell Rotundo, MD.  ICU Pager: [phone] [facility] Office Number: [phone]  Santiago Gladavid Kasa, M.D.  Stephanie AcreVishal Mungal, M.D.  Billy Fischeravid Simonds, M.D  08/14/2015  Critical Care Attestation.  I have personally obtained a history, examined the patient, evaluated laboratory and imaging results, formulated the assessment and plan and placed orders. The Patient requires high complexity decision making for  assessment and support, frequent evaluation and titration of therapies, application of advanced monitoring technologies and extensive interpretation of multiple databases. The patient has critical illness that could lead imminently to failure of 1 or more organ systems and requires the highest level of physician preparedness to intervene.  Critical Care Time devoted to patient care services described in this note is 35 minutes and is exclusive of time spent in procedures.

## 2015-08-14 NOTE — Progress Notes (Signed)
While on rounds, this morning, I discussed with the patient his current medical status with severe cardiomyopathy, current hypotension, acute renal failure. Given the above problems. His prognosis would appear to be poor, I discussed with him the possibility of transfer to tertiary center for further workup, possible LVAD placement. He does not appear to be interested in that at this time, he does not want any heroic interventions, and understands that he may be dying. He does not want to be resuscitated if his heart should stop. We'll therefore change CODE STATUS to DO NOT RESUSCITATE.  Matthew [name], M.D.  08/14/2015

## 2015-08-14 NOTE — Progress Notes (Signed)
Matthew [name]   PATIENT NAME: Matthew [name]    MRN#:  [id number]  DATE OF BIRTH:  [birth year]  SUBJECTIVE:  Hospital Day: 2 days Matthew [name] is a 80 y.o. male presenting with Edema.   Overnight events: No acute overnight events Interval Events: Remains on dobutamine, Levophed, dopamine patient sleeping this morning but arousable  REVIEW OF SYSTEMS:  CONSTITUTIONAL: No fever,Positive fatigue or weakness.  EYES: No blurred or double vision.  EARS, NOSE, AND THROAT: No tinnitus or ear pain.  RESPIRATORY: No cough, positive shortness of breath, denies wheezing or hemoptysis.  CARDIOVASCULAR: No chest pain, orthopnea, positive edema.  GASTROINTESTINAL: No nausea, vomiting, diarrhea or abdominal pain.  GENITOURINARY: No dysuria, hematuria.  ENDOCRINE: No polyuria, nocturia,  HEMATOLOGY: No anemia, easy bruising or bleeding SKIN: No rash or lesion. MUSCULOSKELETAL: No joint pain or arthritis.   NEUROLOGIC: No tingling, numbness, weakness.  PSYCHIATRY: No anxiety or depression.   DRUG ALLERGIES:   Allergies  Allergen Reactions  . Heparin Other (See Comments)    Reaction:  Unknown   . Lipitor [Atorvastatin] Other (See Comments)    Reaction:  Memory issues   . Toujeo Solostar [Insulin Glargine] Other (See Comments)    Reaction:  Weight gain     VITALS:  Blood pressure 83/49, pulse 78, temperature 99.2 F (37.3 C), temperature source Oral, resp. rate 17, height 5\' 11"  (1.803 m), weight 90.22 kg (198 lb 14.4 oz), SpO2 97 %.  PHYSICAL EXAMINATION:  VITAL SIGNS: Filed Vitals:   08/14/15 1200 08/14/15 1216  BP: 83/49   Pulse: 76 78  Temp:  99.2 F (37.3 C)  Resp: 15 17   GENERAL:80 y.o.male currently Critically ill HEAD: Normocephalic, atraumatic.  EYES: Pupils equal, round, reactive to light. Extraocular muscles intact. No scleral icterus.  MOUTH: Moist mucosal membrane. Dentition intact. No abscess noted.  EAR, NOSE,  THROAT: Clear without exudates. No external lesions.  NECK: Supple. No thyromegaly. No nodules. No JVD.  PULMONARY: Diminished without wheeze rails or rhonci. No use of accessory muscles, Good respiratory effort. good air entry bilaterally CHEST: Nontender to palpation.  CARDIOVASCULAR: S1 and S2. Regular rate and rhythm. No murmurs, rubs, or gallops. 2-3+ edema. Pedal pulses 2+ bilaterally.  GASTROINTESTINAL: Soft, nontender, nondistended. No masses. Positive bowel sounds. No hepatosplenomegaly.  MUSCULOSKELETAL: No swelling, clubbing, or edema. Range of motion full in all extremities.  NEUROLOGIC: Cranial nerves II through XII are intact. No gross focal neurological deficits. Sensation intact. Reflexes intact.  SKIN: No ulceration, lesions, rashes, or cyanosis. Skin warm and dry. Turgor intact.  PSYCHIATRIC: Mood, affect within normal limits. The patient is awake, alert and oriented x 3. Insight, judgment intact.      LABORATORY PANEL:   CBC  Recent Labs Lab 08/14/15 0342  WBC 7.8  HGB 10.9*  HCT 32.1*  PLT 140*   ------------------------------------------------------------------------------------------------------------------  Chemistries   Recent Labs Lab 31-Aug-2015 1412  08/13/15 1239 08/14/15 0342  NA 117*  < > 129* 124*  K 7.2*  < > 5.2* 5.5*  CL 83*  < > 91* 88*  CO2 14*  < > 21* 23  GLUCOSE 230*  < > 157* 223*  BUN 163*  < > 97* 102*  CREATININE 9.28*  < > 6.30* 6.71*  CALCIUM 8.6*  < > 8.9 8.8*  MG  --   --  2.9*  --   AST 27  --   --   --   ALT 26  --   --   --  ALKPHOS 111  --   --   --   BILITOT 2.5*  --   --   --   < > = values in this interval not displayed. ------------------------------------------------------------------------------------------------------------------  Cardiac Enzymes  Recent Labs Lab 08/13/15 1239  TROPONINI 0.10*    ------------------------------------------------------------------------------------------------------------------  RADIOLOGY:  Dg Chest 2 View  06-Sep-2015  CLINICAL DATA:  history of CAD s/p CABG, CVA with residual right sided weakness, COPD not on home o2, Atrial flutter s/p ablations and cardioversion on eliquis now, CKD stage 3, CHF with systolic dysfunction EF 15% sent in from nephrology office for worsening pedal edema and weight gain in the last 2-3 weeks.He was just in the hospital 2 weeks ago for the same reason and was discharged on increased dose of torsemide after Lasix drip. His discharge weight was 184 pounds. When he went to see his nephrologist today, weight increased up to 207 pounds again. Worsening ascites in the abdomen and also worsening shortness of breath. He does complain of worsening shortness of breath, increasing dyspnea even with short distances and orthopnea. His urine output has been steadily decreasing.So he is being sent to the hospital for Lasix drip and close monitoring of his renal function. Patient denies any chest pain, nausea vomiting or abdominal pain EXAM: CHEST - 2 VIEW COMPARISON:  07/26/2015 FINDINGS: Stable pleural effusions left greater than right. Adjacent atelectasis or infiltrate in the lung bases left greater than right as before. Heart size upper limits normal. Previous CABG. Left subclavian AICD stable. Mild central pulmonary vascular congestion. No pneumothorax. Visualized skeletal structures are unremarkable. IMPRESSION: 1. Vascular congestion with stable small pleural effusions and bibasilar atelectasis/infiltrate. Electronically Signed   By: Corlis Leak M.D.   On: 2015/09/06 15:09    EKG:   Orders placed or performed during the hospital encounter of 09-06-2015  . EKG 12-Lead  . EKG 12-Lead    ASSESSMENT AND PLAN:   Matthew [name] is a 80 y.o. male presenting with Edema. Admitted September 06, 2015 : Day #: 2 days 1. Cardiogenic shock: Appreciate  cardiology input remain on dobutamine weaning Levophed /dopamine as tolerated 2. Cardiorenal syndrome: Dialysis as per nephrology 3. Type 2 diabetes insulin-requiring add Lantus continue on sliding scale coverage 4. Recent DVT apixaban 5. GERD without esophagitis PPI therapy   All the records are reviewed and case discussed with Care Management/Social Workerr. Management plans discussed with the patient, family and they are in agreement.  CODE STATUS: dnr TOTAL TIME TAKING CARE OF THIS PATIENT: 33 minutes.   POSSIBLE D/C IN 2-3DAYS, DEPENDING ON CLINICAL CONDITION.  Remains critically ill patient for possible comfort measures  Autumn Gunn,  Mardi Mainland.D on 08/14/2015 at 12:50 PM  Between 7am to 6pm - Pager - [phone]  After 6pm: House Pager: - [phone]  [facility]  Office  [phone]  CC: Primary care physician; Vonita Moss, MD

## 2015-08-14 NOTE — Progress Notes (Signed)
Notified Dr. Edrick KinsSchiner about patient's hemodialysis line is oozing blood- and was told by the night nurse that he had been oozing blood for past couple of days from that site now.  Dr. Edrick KinsSchiner said he would come assess after he finishes his procedure.

## 2015-08-14 NOTE — Consult Note (Signed)
Palliative Medicine Inpatient Consult Note   Name: Matthew [name] Date: 08/14/2015 MRN: [id number]  DOB: [birth year]  Referring Physician: Lytle Butte, MD  Palliative Care consult requested for this 80 y.o. male for goals of medical therapy in patient with multiple serious chronic medical conditions who came in with worsening pedal edema related to severe cardiomyopathy.    DISCUSSIONS AND DECISIONS: 1.  Pt was made DNR status this am during conversation between Dr Ashby Dawes and pt.  2.  I have met privately with family and also privately with Matthew [name].  Matthew [name] had apparently started telling family and staff last evening that he did not want aggressive care any longer.  He also communicated as much to the Critical Care physician this am. As of this am, he is DNR status. At this time, family has already started to accept that pt is asking to have the pressors and the dialysis stopped.  Pt and family agree that a planned terminal withdrawal is what he would like rather than existing instead of living going forward.  Pt has been informed that given the severity of the combined heart and renal failure, even very aggressive care would not necessarily result in an outcome he would be content to live with.  He would not want to risk going through months more of debility to get just a bit more function.  He is at peace with his decision and it is consistent with statements he has made over his lifetime about not wanting extraordinary medical interventions at an older age.  Family is also at peace with his decision for they know that if he cannot live fully independently, with energy and vigor, he would not want to go on, for that would equal suffering to him.    All agree that a withdrawal from pressors is to take place at around 11 am tomorrow.  I will be there before that time and during that time.    3. A low dose morphine drip will be started by myself prior to the planned withdrawal in  order to prevent suffering from air hunger which could develop.  Pt and family understand that the purpose is to help relieve suffering and that his demise will be from his disease and not from medications.    4.  Family asked me to give him something to help him sleep tonight.  Pt agrees.  If that drops his blood pressure, no one minds that.  5.  At this time, the plan until 11 am is to NOT TITRATE THE PRESSORS.  We won't go down or up --even if he drops his blood pressure.  They do not want [place] being aggressive to save his life tonight in order for him to die tomorrow on a planned schedule.  I will have to explain this to e-link.  He is basically on a modified comfort care regimen.  6.  Family plans on having 3-4 people present tomorrow at 30 am.  Pts wife is not crazy about the idea of having the young adult grandchildren present, but they will work this out today.  No one younger than 15 will be present. Will notify chaplain and obtain a bereavement tray.  7.  Pt and family are aware that he could linger and that we might end up transferring him to 1C.  But we will have to see how he does when the pressors are DCd.    9.  There is not to be further dialysis or  labs.  I will notify the nephrologist and rest of care team.  I have discussed with critical care and ICU nursing so far.  10.  I will need to call to get the ICD turned off.       CLINICAL NARRATIVE: Pt is a 80 yo man with h/o CAD (s/p CANG), CVA (with residual right sided weakness), and COPD (not on home O2).  He has severe cardiomyopathy with systolic dysfunction with EF of 15% and he also developed A Flutter (s/p ablations and cardioversion --now on Eliquis). He was known to have CKD stage 3 at admission.  He was hospitalized about 2 weeks prior to this admission.  He had been discharged on an increased dose of Torsemide after having been treated as an inpt with a lasix drip.  His discharge wt was 184 but when he went to see his  nephrologist on 5/8, his wt was up to 207 lbs again.  Creatinine was noted to be 9.28 with BUN 163 and Potassium was 7.2 with sodium 117.  He had worsening ascites and was short of breath.  Urine output had been decreasing. HD was started emergently on admission on 5/8.  Cardiology rec: Dopamine drip.  Pt had 1.5 L removed with HD on 5/9 then HR increased and BP dropped./ Pt had Levo started and dopamine decreased to decrease the tachycardia (runs of SVT up to 140 post HD).   Dr Ashby Dawes discussed possible need for transfer to tertiary care center and possible LVAD placement and also the poor overall prognosis even is all this is done. Pt elected to be DNR status. Family may be inclined to consider a palliative approach to care at this time.  Given his dependency on life supporting medications and dialysis, comfort care for him would be terminal comfort care.    Today, his sodium is 124, potassium is 5.5, Cr is 6.71, glucose is 223, and platelets are 140 with Hgb 10.9.  No infections are evident. Troponin has been slightly elevated due to demand ischemia.   ACTIVE PROBLEMS: Cardiogenic Shock ---on multiple pressors (dopamine, dobutamine, levophed) Severe acute on chronic systolic CHF --EF 27% and now up to 20-25% --s/p St Jude ICD device implanted on 12/31/05.  Acute on Chronic Renal Failure --baseline CKD stage 3-4 --baseline Cr 2.19 with dGFR 27 on 4/25.  ---due to acute cardiorenal sysndrom and/or overdiuresis.   ---still w/ hyperkalemia despite being on a loop diuretic and low potassium diet.   ---started HD and got IV albumin on 5/9 ---has secondary HPTH Hyperkalemia Hyponatremia Hypoalbuminemia Anemia CAD ---s/p CABG x3 CVA --with residual right sided weakness Afib/ flutter --s/p ablations and cardioversion --on Eliquis COPD H/O HTN Hyperlipidemia GERD ---on protonix DM2       REVIEW OF SYSTEMS:  Pt denies symptoms of pain, shortness of breath, or anxiety at this  time. Family says he does have pain in his upper back but he is also someone who will minimize pain and any other symptom.     SPIRITUAL SUPPORT SYSTEM: Yes.  SOCIAL HISTORY:  reports that he quit smoking about 46 years ago. His smoking use included Cigarettes, Cigars, and Pipe. He quit smokeless tobacco use about 35 years ago. His smokeless tobacco use included Chew. He reports that he does not drink alcohol or use illicit drugs.  LEGAL DOCUMENTS:  I will place a signed portable DNR document in the record.   CODE STATUS: DNR as of this am.  PAST MEDICAL HISTORY: Past Medical History  Diagnosis Date  . CAD (coronary artery disease)     s/p CABG  . Cerebral vascular accident [facility])     with residual right sided weakness  . Restrictive lung disease     Not on home o2  . A-fib [facility])     s/p ablation  . COPD (chronic obstructive pulmonary disease) ([place])   . Chronic kidney disease     stage 3, GFR 31 at baseline  . Diabetes mellitus without complication ([place])   . Type 2 diabetes mellitus with stage 3 chronic kidney disease ([place])   . Cardiomyopathy ([place])     Last EF 15% in Nov 2016  . Hyperlipidemia   . Hypertension   . Diabetic retinopathy ([place])   . BPH (benign prostatic hyperplasia)     PAST SURGICAL HISTORY:  Past Surgical History  Procedure Laterality Date  . Coronary artery bypass graft    . Pacemaker insertion    . Vasectomy    . Appendectomy    . Cardiac defibrillator placement      ALLERGIES:  is allergic to heparin; lipitor; and toujeo solostar.  MEDICATIONS:  Current Facility-Administered Medications  Medication Dose Route Frequency Provider Last Rate Last Dose  . acetaminophen (TYLENOL) tablet 650 mg  650 mg Oral Q6H PRN Gladstone Lighter, MD       Or  . acetaminophen (TYLENOL) suppository 650 mg  650 mg Rectal Q6H PRN Gladstone Lighter, MD      . anticoagulant sodium citrate solution 5 mL  5 mL Intravenous Q dialysis Lavonia Dana, MD      . antiseptic  oral rinse (CPC / CETYLPYRIDINIUM CHLORIDE 0.05%) solution 7 mL  7 mL Mouth Rinse BID Lytle Butte, MD   7 mL at 08/14/15 1042  . apixaban (ELIQUIS) tablet 2.5 mg  2.5 mg Oral BID Flora Lipps, MD   2.5 mg at 08/13/15 2124  . aspirin EC tablet 81 mg  81 mg Oral Daily Gladstone Lighter, MD   81 mg at 08/13/15 1028  . cholecalciferol (VITAMIN D) tablet 1,000 Units  1,000 Units Oral Daily Gladstone Lighter, MD   1,000 Units at 08/14/15 1035  . DOBUTamine (DOBUTREX) infusion 4000 mcg/mL  2.5-20 mcg/kg/min Intravenous Titrated Gladstone Lighter, MD 10.8 mL/hr at 08/14/15 1034 8 mcg/kg/min at 08/14/15 1034  . docusate sodium (COLACE) capsule 100 mg  100 mg Oral BID Gladstone Lighter, MD   100 mg at 08/14/15 1035  . DOPamine (INTROPIN) 800 mg in dextrose 5 % 250 mL (3.2 mg/mL) infusion  0-20 mcg/kg/min Intravenous Titrated Lavonia Dana, MD 5.1 mL/hr at 08/14/15 0700 3 mcg/kg/min at 08/14/15 0700  . ezetimibe (ZETIA) tablet 10 mg  10 mg Oral Daily Gladstone Lighter, MD   10 mg at 08/14/15 1035  . feeding supplement (NEPRO CARB STEADY) liquid 237 mL  237 mL Oral BID BM Gladstone Lighter, MD   237 mL at 08/14/15 1036  . ferrous sulfate tablet 325 mg  325 mg Oral Q breakfast Gladstone Lighter, MD   325 mg at 08/14/15 0814  . finasteride (PROSCAR) tablet 5 mg  5 mg Oral QHS Gladstone Lighter, MD   5 mg at 08/13/15 2124  . insulin aspart (novoLOG) injection 0-15 Units  0-15 Units Subcutaneous TID WC Nicholes Mango, MD   5 Units at 08/14/15 0821  . insulin aspart (novoLOG) injection 0-5 Units  0-5 Units Subcutaneous QHS Nicholes Mango, MD   2 Units at 08/13/15 2246  . magnesium oxide (MAG-OX) tablet 400 mg  400  mg Oral Daily Gladstone Lighter, MD   400 mg at 08/14/15 1035  . multivitamin with minerals tablet 1 tablet  1 tablet Oral Daily Gladstone Lighter, MD   1 tablet at 08/14/15 1035  . norepinephrine (LEVOPHED) 16 mg in dextrose 5 % 250 mL (0.064 mg/mL) infusion  0-40 mcg/min Intravenous Titrated Aruna Gouru, MD  9.4 mL/hr at 08/14/15 0700 10 mcg/min at 08/14/15 0700  . omega-3 acid ethyl esters (LOVAZA) capsule 2 g  2 capsule Oral BID Gladstone Lighter, MD   2 g at 08/14/15 1035  . ondansetron (ZOFRAN) tablet 4 mg  4 mg Oral Q6H PRN Gladstone Lighter, MD       Or  . ondansetron (ZOFRAN) injection 4 mg  4 mg Intravenous Q6H PRN Gladstone Lighter, MD      . pantoprazole (PROTONIX) EC tablet 40 mg  40 mg Oral Daily Gladstone Lighter, MD   40 mg at 08/14/15 1035  . polyethylene glycol (MIRALAX / GLYCOLAX) packet 17 g  17 g Oral Daily PRN Gladstone Lighter, MD      . pravastatin (PRAVACHOL) tablet 80 mg  80 mg Oral QHS Gladstone Lighter, MD   80 mg at 08/13/15 2124  . pyridOXINE (VITAMIN B-6) tablet 50 mg  50 mg Oral Daily Gladstone Lighter, MD   50 mg at 08/14/15 1035  . sevelamer carbonate (RENVELA) tablet 800 mg  800 mg Oral TID WC Lavonia Dana, MD   800 mg at 08/14/15 0814  . sodium chloride flush (NS) 0.9 % injection 3 mL  3 mL Intravenous Q12H Gladstone Lighter, MD   3 mL at 08/14/15 1036  . tamsulosin (FLOMAX) capsule 0.4 mg  0.4 mg Oral BID Gladstone Lighter, MD   0.4 mg at 08/14/15 1035  . tiotropium (SPIRIVA) inhalation capsule 18 mcg  18 mcg Inhalation Daily Gladstone Lighter, MD   18 mcg at 08/14/15 0814  . vitamin B-12 (CYANOCOBALAMIN) tablet 100 mcg  100 mcg Oral Daily Gladstone Lighter, MD   100 mcg at 08/14/15 1035  . vitamin E capsule 400 Units  400 Units Oral Daily Gladstone Lighter, MD   400 Units at 08/14/15 1035    Vital Signs: BP 91/54 mmHg  Pulse 88  Temp(Src) 99 F (37.2 C) (Oral)  Resp 18  Ht 5' 11"  (1.803 m)  Wt 90.22 kg (198 lb 14.4 oz)  BMI 27.75 kg/m2  SpO2 98% Filed Weights   08/25/2015 1355  Weight: 90.22 kg (198 lb 14.4 oz)    Estimated body mass index is 27.75 kg/(m^2) as calculated from the following:   Height as of this encounter: 5' 11"  (1.803 m).   Weight as of this encounter: 90.22 kg (198 lb 14.4 oz).  PERFORMANCE STATUS (ECOG) : 4 - Bedbound  PHYSICAL  EXAM: NAD Sleeping but wakens Is alert and completely oriented and answers and asks questions appropriately EOMI No JVD seen Hrt rrr with irreg beats heard and PVCs seen on tele. Lungs decreased BS bases bilat Abd soft and NT Ext edema and pallor   LABS: CBC:    Component Value Date/Time   WBC 7.8 08/14/2015 0342   WBC 7.4 05/27/2015 1350   HGB 10.9* 08/14/2015 0342   HCT 32.1* 08/14/2015 0342   HCT 36.0* 05/27/2015 1350   PLT 140* 08/14/2015 0342   PLT 150 05/27/2015 1350   MCV 93.8 08/14/2015 0342   MCV 95 05/27/2015 1350   NEUTROABS 5.4 05/27/2015 1350   LYMPHSABS 1.0 05/27/2015 1350   EOSABS 0.4 05/27/2015  1350   BASOSABS 0.0 05/27/2015 1350   Comprehensive Metabolic Panel:    Component Value Date/Time   NA 124* 08/14/2015 0342   NA 134 05/27/2015 1350   K 5.5* 08/14/2015 0342   CL 88* 08/14/2015 0342   CO2 23 08/14/2015 0342   BUN 102* 08/14/2015 0342   BUN 62* 05/27/2015 1350   CREATININE 6.71* 08/14/2015 0342   GLUCOSE 223* 08/14/2015 0342   GLUCOSE 215* 05/27/2015 1350   CALCIUM 8.8* 08/14/2015 0342   AST 27 08/27/2015 1412   ALT 26 08/21/2015 1412   ALKPHOS 111 08/15/2015 1412   BILITOT 2.5* 08/25/2015 1412   BILITOT 2.2* 05/27/2015 1350   PROT 6.4* 08/22/2015 1412   PROT 7.2 05/27/2015 1350   ALBUMIN 2.9* 08/13/2015 0323   ALBUMIN 4.3 05/27/2015 1350    More than 50% of the visit was spent in counseling/coordination of care: Yes  Time Spent: 80 minutes

## 2015-08-14 NOTE — Progress Notes (Signed)
Gave updated report to Maralyn SagoSarah, Charity fundraiserN.  Plan for patient is comfort care tomorrow at 11am per Dr. Orvan Falconerampbell.  Patient remaining on drips(dobutamine, dopamine, levophed).  Per Dr. Wess Bottsampell and Lexine BatonBincy- NP- do not titrate up the drips- only wean down if needed.  Patient comfortable- no complaints of pain.  Right groin site reinforced- due to bleeding- eliquis held per Bincy- NP.  Dr. Edrick KinsSchiner is here at this time to assess patient.

## 2015-08-14 NOTE — Progress Notes (Signed)
Asked Dr. Darrold JunkerParaschos how he would like me to manage the patient's drips at this time.  He stated to go up to 5mcqs on the Dobutamine drip and try to wean off levophed and keep dopamine infusing.

## 2015-08-14 NOTE — Progress Notes (Signed)
Notified Dr. Wynelle [name] that I was told from night RN that patient received dialysis past 2 days- then [facility]- so dobutamine, dopamine and levophed was started.  Patient requesting to not have dialysis and to stop all care.

## 2015-08-14 NOTE — Progress Notes (Addendum)
Notified Dr. Ardyth Manam that patient stating to me that he does not want to continue any care.  He does not want dialysis and does not want to be on any drips and would like to go home.  He states that he feels like "there is no point to all of this- being on medicine and dialysis.  I just want to go home and be with my family."  I stated that I related this information to Dr. Wynelle LinkKolluru- He stated he would hold off on dialysis at this time until Pallative is consulted.  Patient also states that he does not remember his last bowel movement- he stated that he is constipated but would not want to take any medicine that will make him have a BM.

## 2015-08-15 DIAGNOSIS — Z515 Encounter for palliative care: Secondary | ICD-10-CM

## 2015-08-15 DIAGNOSIS — N183 Chronic kidney disease, stage 3 (moderate): Secondary | ICD-10-CM

## 2015-08-15 DIAGNOSIS — N179 Acute kidney failure, unspecified: Secondary | ICD-10-CM

## 2015-08-15 DIAGNOSIS — J449 Chronic obstructive pulmonary disease, unspecified: Secondary | ICD-10-CM

## 2015-08-15 DIAGNOSIS — Z951 Presence of aortocoronary bypass graft: Secondary | ICD-10-CM

## 2015-08-15 DIAGNOSIS — I129 Hypertensive chronic kidney disease with stage 1 through stage 4 chronic kidney disease, or unspecified chronic kidney disease: Secondary | ICD-10-CM

## 2015-08-15 DIAGNOSIS — E1122 Type 2 diabetes mellitus with diabetic chronic kidney disease: Secondary | ICD-10-CM

## 2015-08-15 DIAGNOSIS — E8809 Other disorders of plasma-protein metabolism, not elsewhere classified: Secondary | ICD-10-CM

## 2015-08-15 DIAGNOSIS — R570 Cardiogenic shock: Secondary | ICD-10-CM

## 2015-08-15 DIAGNOSIS — D649 Anemia, unspecified: Secondary | ICD-10-CM

## 2015-08-15 DIAGNOSIS — I2510 Atherosclerotic heart disease of native coronary artery without angina pectoris: Secondary | ICD-10-CM

## 2015-08-15 DIAGNOSIS — E875 Hyperkalemia: Secondary | ICD-10-CM

## 2015-08-15 DIAGNOSIS — E871 Hypo-osmolality and hyponatremia: Secondary | ICD-10-CM

## 2015-08-15 DIAGNOSIS — R0600 Dyspnea, unspecified: Secondary | ICD-10-CM | POA: Insufficient documentation

## 2015-08-15 MED ORDER — LORAZEPAM 2 MG/ML IJ SOLN
1.0000 mg | Freq: Four times a day (QID) | INTRAMUSCULAR | Status: DC
  Administered 2015-08-15: 1 mg via INTRAVENOUS
  Filled 2015-08-15: qty 1

## 2015-08-15 MED ORDER — MORPHINE 100MG IN NS 100ML (1MG/ML) PREMIX INFUSION: 3.0000 mg/h | INTRAVENOUS | Status: DC

## 2015-08-15 MED ORDER — LORAZEPAM 2 MG/ML IJ SOLN
2.0000 mg | Freq: Once | INTRAMUSCULAR | Status: AC
  Administered 2015-08-15: 2 mg via INTRAVENOUS
  Filled 2015-08-15: qty 1

## 2015-08-15 MED ORDER — LORAZEPAM 2 MG/ML IJ SOLN: 1.0000 mg | INTRAMUSCULAR | Status: DC | PRN

## 2015-08-15 MED ORDER — MORPHINE 100MG IN NS 100ML (1MG/ML) PREMIX INFUSION
3.0000 mg/h | INTRAVENOUS | Status: DC
  Administered 2015-08-15: 13 mg/h via INTRAVENOUS
  Administered 2015-08-15: 3 mg/h via INTRAVENOUS
  Filled 2015-08-15 (×2): qty 100

## 2015-08-15 NOTE — Progress Notes (Signed)
Chaplain rounded the unit and provided a compassionate presence for the patient and family. The patient spoke about how good it was and blessed he is to have the many family and friends come by to see him. Jefm PettyChaplain Ladona Rosten [phone]

## 2015-08-15 NOTE — Progress Notes (Signed)
Pt planned to be transitioned to full comfort measures only today.  Will defer further management to hospitalist service and palliative care.  Critical care service will sign off for now, please call if needed.   Wells Guiles-Deep Sheniah Supak, M.D.  08/15/2015

## 2015-08-15 NOTE — Progress Notes (Signed)
Patient is resting comfortably on the morphine gtt, no signs of discomfort or distress.  Family at bedside, informed of move to 1C.  Report called to Dahlia ClientHannah, Charity fundraiserN.  Patient transported to Rm 106.  Will continue to monitor.

## 2015-08-15 NOTE — Progress Notes (Signed)
Patient comfortable with irregular respirations.  No grimacing or gasping for breaths noted.  Morphine drip infusing and family at bedside. Hand off report given to [facility] BreaMichelle, Charity fundraiserN.  Dr. Orvan Falconerampbell also on unit and saw patient and family then instructed nurse to move patient to 1C.

## 2015-08-15 NOTE — Progress Notes (Signed)
Palliative Medicine Inpatient Consult Follow Up Note   Name: Matthew [name] Date: 08/15/2015 MRN: [id number]  DOB: 1935/10/26  Referring Physician: Wyatt Haste, MD  Palliative Care consult requested for this 80 y.o. male for goals of medical therapy in patient with heart and kidney failure.  DISCUSSIONS AND DECISIONS: Pt had a lot of family coming and going so the planned withdrawal of care for 11 am was changed to 1:30 pm.  At that time, the three pressors were discontinued. Pt has had titration of morphine drip from 3 mg up to 9 mg (slowly ) to address grimacing as it has occurred.  I have seen him grimace and family and I were very much focused on him being comfortable.    At this time, (3:30 pm), he is still alive but he has Cheyne-Stokes respiration pattern and his BP is holding steady at 56/34.  Death is expected soon. Pulses are present but weak.  Heart sounds can be heard but they are distant soft sounds.  His Cheyne-Stokes breathing is the sign that he continues to live. He has a pacer in place, so we anticipate that he will go into PEA at some point.    Family is comfortable with the care given.  I have asked if they have any questions or concerns and they feel that we have addressed all pertinent matters and that the care is responsive and appropriate. One nephew is a Therapist, sports.  I was able to give him the horribly bad lab values pt had during this stay and I was also able to communicate to some of the grandchildren that his numbers and his internal organs do not look like he looks. He looks pretty good on the outside, but inside, his essential organs are 'shot'.    Will continue with comfort terminal care. If he lingers further, we might move him to 1C. He is NOT STABLE FOR TRANSFER OUT OF HOSPITAL SO HOSPICE IS NOT A CONSIDERATION (given SBP in the 50's).     ACTIVE PROBLEMS: Cardiogenic Shock ---on multiple pressors (dopamine, dobutamine, levophed) Severe acute on chronic  systolic CHF --EF 15% and now up to 20-25% --s/p St Jude ICD device implanted on 12/31/05.  Acute on Chronic Renal Failure --baseline CKD stage 3-4 --baseline Cr 2.19 with dGFR 27 on 4/25.  ---due to acute cardiorenal sysndrom and/or overdiuresis.  ---still w/ hyperkalemia despite being on a loop diuretic and low potassium diet.  ---started HD and got IV albumin on 5/9 ---has secondary HPTH Hyperkalemia Hyponatremia Hypoalbuminemia Anemia CAD ---s/p CABG x3 CVA --with residual right sided weakness Afib/ flutter --s/p ablations and cardioversion --on Eliquis COPD H/O HTN Hyperlipidemia GERD ---on protonix DM2   CODE STATUS: DNR   PAST MEDICAL HISTORY: Past Medical History  Diagnosis Date  . CAD (coronary artery disease)     s/p CABG  . Cerebral vascular accident [facility])     with residual right sided weakness  . Restrictive lung disease     Not on home o2  . A-fib [facility])     s/p ablation  . COPD (chronic obstructive pulmonary disease) ([facility])   . Chronic kidney disease     stage 3, GFR 31 at baseline  . Diabetes mellitus without complication ([facility])   . Type 2 diabetes mellitus with stage 3 chronic kidney disease ([facility])   . Cardiomyopathy ([facility])     Last EF 15% in Nov 2016  . Hyperlipidemia   . Hypertension   . Diabetic retinopathy ([facility])   .  BPH (benign prostatic hyperplasia)     PAST SURGICAL HISTORY:  Past Surgical History  Procedure Laterality Date  . Coronary artery bypass graft    . Pacemaker insertion    . Vasectomy    . Appendectomy    . Cardiac defibrillator placement      Vital Signs: BP 84/47 mmHg  Pulse 83  Temp(Src) 97.8 F (36.6 C) (Axillary)  Resp 13  Ht 5\' 11"  (1.803 m)  Wt 90.22 kg (198 lb 14.4 oz)  BMI 27.75 kg/m2  SpO2 97% Filed Weights   06-13-2015 1355  Weight: 90.22 kg (198 lb 14.4 oz)    Estimated body mass index is 27.75 kg/(m^2) as calculated from the following:   Height as of this encounter: 5\' 11"  (1.803 m).   Weight  as of this encounter: 90.22 kg (198 lb 14.4 oz).  PHYSICAL EXAM: Alert earlier, now sleeping peacefully Eyes closed No JVD or Tm Hrt rrr --distant HS Lungs with decreased BS bases Abd soft and NT Ext no mottling or cyanosis   LABS: CBC:    Component Value Date/Time   WBC 7.8 08/14/2015 0342   WBC 7.4 05/27/2015 1350   HGB 10.9* 08/14/2015 0342   HCT 32.1* 08/14/2015 0342   HCT 36.0* 05/27/2015 1350   PLT 140* 08/14/2015 0342   PLT 150 05/27/2015 1350   MCV 93.8 08/14/2015 0342   MCV 95 05/27/2015 1350   NEUTROABS 5.4 05/27/2015 1350   LYMPHSABS 1.0 05/27/2015 1350   EOSABS 0.4 05/27/2015 1350   BASOSABS 0.0 05/27/2015 1350   Comprehensive Metabolic Panel:    Component Value Date/Time   NA 124* 08/14/2015 0342   NA 134 05/27/2015 1350   K 5.5* 08/14/2015 0342   CL 88* 08/14/2015 0342   CO2 23 08/14/2015 0342   BUN 102* 08/14/2015 0342   BUN 62* 05/27/2015 1350   CREATININE 6.71* 08/14/2015 0342   GLUCOSE 223* 08/14/2015 0342   GLUCOSE 215* 05/27/2015 1350   CALCIUM 8.8* 08/14/2015 0342   AST 27 06-02-15 1412   ALT 26 06-02-15 1412   ALKPHOS 111 06-02-15 1412   BILITOT 2.5* 06-02-15 1412   BILITOT 2.2* 05/27/2015 1350   PROT 6.4* 06-02-15 1412   PROT 7.2 05/27/2015 1350   ALBUMIN 2.9* 08/13/2015 0323   ALBUMIN 4.3 05/27/2015 1350    More than 50% of the visit was spent in counseling/coordination of care: YES  Time Spent:  120 min

## 2015-08-15 NOTE — Progress Notes (Signed)
Patient has remained comfortable on Dobutamine, Dopamine, and Levophed throughout the night although blood pressure has dropped slightly into 70's systolic.  Per order, we are not titrating gtt.  No reports of pain or shortness of breath.  Family has been visiting in and out, patient has been resting in between times.  Trialysis catheter continues to ooze some blood, dressing reinforced with gauze.  Plans to turn off ICD and discontinue gtt this am still in place.  See flowsheet for detailed assessment.  Will continue to monitor.

## 2015-08-15 NOTE — Progress Notes (Signed)
[facility] [place] [facility]   PATIENT NAME: Matthew [name]    MRN#:  [id number]  DATE OF BIRTH:  08/24/1935  SUBJECTIVE:  Hospital Day: 3 days Matthew [name].   Overnight events: Hypotensive, reviewed case with palliative care plan for withdrawal pressors today Interval Events: Remains on dobutamine, Levophed, dopamine -no real complaints, family at bedside appreciative of care  REVIEW OF SYSTEMS:  CONSTITUTIONAL: No fever,Positive fatigue or weakness.  EYES: No blurred or double vision.  EARS, NOSE, AND THROAT: No tinnitus or ear pain.  RESPIRATORY: No cough, positive shortness of breath, denies wheezing or hemoptysis.  CARDIOVASCULAR: No chest pain, orthopnea, positive [name].  GASTROINTESTINAL: No nausea, vomiting, diarrhea or abdominal pain.  GENITOURINARY: No dysuria, hematuria.  ENDOCRINE: No polyuria, nocturia,  HEMATOLOGY: No anemia, easy bruising or bleeding SKIN: No rash or lesion. MUSCULOSKELETAL: No joint pain or arthritis.   NEUROLOGIC: No tingling, numbness, weakness.  PSYCHIATRY: No anxiety or depression.   DRUG ALLERGIES:   Allergies  Allergen Reactions  . Heparin Other (See Comments)    Reaction:  Unknown   . Lipitor [Atorvastatin] Other (See Comments)    Reaction:  Memory issues   . Toujeo Solostar [Insulin Glargine] Other (See Comments)    Reaction:  Weight gain     VITALS:  Blood pressure 84/47, pulse 83, temperature 97.8 F (36.6 C), temperature source Axillary, resp. rate 18, height 5\' 11"  (1.803 m), weight 90.22 kg (198 lb 14.4 oz), SpO2 94 %.  PHYSICAL EXAMINATION:  VITAL SIGNS: Filed Vitals:   08/15/15 0900 08/15/15 1000  BP: 87/54 84/47  Pulse: 83   Temp:    Resp: 16 18   GENERAL:80 y.o.male currently Critically ill HEAD: Normocephalic, atraumatic.  EYES: Pupils equal, round, reactive to light. Extraocular muscles intact. No scleral icterus.  MOUTH: Moist mucosal  membrane. Dentition intact. No abscess noted.  EAR, NOSE, THROAT: Clear without exudates. No external lesions.  NECK: Supple. No thyromegaly. No nodules. No JVD.  PULMONARY: Diminished without wheeze rails or rhonci. No use of accessory muscles, Good respiratory effort. good air entry bilaterally CHEST: Nontender to palpation.  CARDIOVASCULAR: S1 and S2. Regular rate and rhythm. No murmurs, rubs, or gallops. 2-3+ [name]. Pedal pulses 2+ bilaterally.  GASTROINTESTINAL: Soft, nontender, nondistended. No masses. Positive bowel sounds. No hepatosplenomegaly.  MUSCULOSKELETAL: No swelling, clubbing, or [name]. Range of motion full in all extremities.  NEUROLOGIC: Cranial nerves II through XII are intact. No gross focal neurological deficits. Sensation intact. Reflexes intact.  SKIN: No ulceration, lesions, rashes, or cyanosis. Skin warm and dry. Turgor intact.  PSYCHIATRIC: Mood, affect within normal limits. The patient is awake, alert and oriented x 3. Insight, judgment intact.      LABORATORY PANEL:   CBC  Recent Labs Lab 08/14/15 0342  WBC 7.8  HGB 10.9*  HCT 32.1*  PLT 140*   ------------------------------------------------------------------------------------------------------------------  Chemistries   Recent Labs Lab 08/19/2015 1412  08/13/15 1239 08/14/15 0342  NA 117*  < > 129* 124*  K 7.2*  < > 5.2* 5.5*  CL 83*  < > 91* 88*  CO2 14*  < > 21* 23  GLUCOSE 230*  < > 157* 223*  BUN 163*  < > 97* 102*  CREATININE 9.28*  < > 6.30* 6.71*  CALCIUM 8.6*  < > 8.9 8.8*  MG  --   --  2.9*  --   AST 27  --   --   --  ALT 26  --   --   --   ALKPHOS 111  --   --   --   BILITOT 2.5*  --   --   --   < > = values in this interval not displayed. ------------------------------------------------------------------------------------------------------------------  Cardiac Enzymes  Recent Labs Lab 08/13/15 1239  TROPONINI 0.10*    ------------------------------------------------------------------------------------------------------------------  RADIOLOGY:  No results found.  EKG:   Orders placed or performed during the hospital encounter of 2015-08-25  . EKG 12-Lead  . EKG 12-Lead    ASSESSMENT AND PLAN:   Matthew [name] is a 80 y.o. male presenting with [name]. Admitted 08/25/15 : Day #: 3 days 1. Cardiogenic shock:  2. Cardiorenal syndrome:  3. Type 2 diabetes insulin-requiring  4. Recent DVT  5. GERD without esophagitis    Disposition: Given general poor prognosis this patient would require dialysis plus/minus LVAD placement. He and his family have decided against these aggressive medical treatments and opted instead for comfort measures. They will be reviewed discontinuing pressor therapy and transitioning to comfort measures today at 11 AM. With withdrawal of medications if patient's death is imminent can remain in the intensive care unit if stabilizes can be transferred to 1c in the unlikely event the patient is stable and appears to live longer can work on finding placement hospice home versus home with hospice   All the records are reviewed and case discussed with Care Management/Social Workerr. Management plans discussed with the patient, family and they are in agreement.  CODE STATUS: dnr TOTAL TIME TAKING CARE OF THIS PATIENT: 33 minutes.   POSSIBLE D/C IN 2-3DAYS, DEPENDING ON CLINICAL CONDITION.  Remains critically ill patient for possible comfort measures  Hower,  Mardi Mainland.D on 08/15/2015 at 11:55 AM  Between 7am to 6pm - Pager - [phone]  After 6pm: House Pager: - [phone]  [facility]  Office  [phone]  CC: Primary care physician; Vonita Moss, MD

## 2015-08-15 NOTE — Progress Notes (Signed)
Palliative Care Update  Pt is still with Matthew [name].  I have asked staff to transfer pt to 1C. I don't think he has long to live, but in actullity, we have thought this for hours now and it would probably be better for pt and family if pt was in larger comfort care roomon 1C where all the family can sit or come and go, etc.  Pt has grimaced a few times since last morphine increase to 11 mg/ hr.  Will order a dose of Ativan to see if this helps with his comfort management.     Cammie McgeeMargaret Ferriter Copper Kirtley, MD

## 2015-08-15 NOTE — Progress Notes (Signed)
Vasopressors stopped per Dr. Lovina Reachampbells comfort care orders. Morphine drip infusing.  Family at bedside. Patient is full comfort care.

## 2015-08-16 ENCOUNTER — Other Ambulatory Visit: Payer: Self-pay | Admitting: *Deleted

## 2015-08-16 ENCOUNTER — Encounter: Payer: Self-pay | Admitting: *Deleted

## 2015-08-26 ENCOUNTER — Ambulatory Visit: Payer: PPO | Admitting: Family Medicine

## 2015-09-05 NOTE — Discharge Summary (Signed)
   [facility] [place] [facility]   PATIENT NAME: Matthew PiggWilliam [name]    MR#:  [id number]  DATE OF BIRTH:  04/09/1935  DATE OF ADMISSION:  08/25/2015 ADMITTING PHYSICIAN: Enid Baasadhika Kalisetti, MD  DATE OF DISCHARGE: 08/06/2015  PRIMARY CARE PHYSICIAN: Vonita MossMark Crissman, MD    ADMISSION DIAGNOSIS:  Shortness of Breath Edema CKD  DISCHARGE DIAGNOSIS:  Cardiogenic shock Cardiorenal syndrome SECONDARY DIAGNOSIS:   Past Medical History  Diagnosis Date  . CAD (coronary artery disease)     s/p CABG  . Cerebral vascular accident [facility](HCC)     with residual right sided weakness  . Restrictive lung disease     Not on home o2  . A-fib [facility](HCC)     s/p ablation  . COPD (chronic obstructive pulmonary disease) (HCC)   . Chronic kidney disease     stage 3, GFR 31 at baseline  . Diabetes mellitus without complication (HCC)   . Type 2 diabetes mellitus with stage 3 chronic kidney disease (HCC)   . Cardiomyopathy (HCC)     Last EF 15% in Nov 2016  . Hyperlipidemia   . Hypertension   . Diabetic retinopathy (HCC)   . BPH (benign prostatic hyperplasia)     HOSPITAL COURSE:  Matthew PiggWilliam [name]  is a 80 y.o. male admitted 08/11/2015 with chief complaint Shortness of breath and edema. Please see H&P performed by Enid Baasadhika Kalisetti, MD for further information. Patient presented to the hospital with the above complaints given level of kidney function and elevated potassium required urgent dialysis. His blood work originally improved with dialysis however blood pressure worsened he required dobutamine, dopamine, Levophed. Given his clinical deterioration has discussed that he would likely require long-term dialysis as well as likely also require an LVAD for continuation of care. The patient was adamant against this idea after lengthy discussions with the patient and his family decided become DO NOT RESUSCITATE/comfort measures. He was taken off of his 3 pressors blood pressure dropped down into the  50s he was placed on morphine for agonal breathing and subsequent died of the above conditions  Cause of death  cardiogenic shock - acute on chronic systolic congestive heart failure Cardiorenal syndrome

## 2015-09-05 DEATH — deceased

## 2015-09-09 ENCOUNTER — Ambulatory Visit: Payer: PPO | Admitting: Family

## 2016-01-14 IMAGING — CR DG CHEST 2V
1 series · 2 of 2 positions shown · non-contrast
Comparison: 10/10/2009

CLINICAL DATA: Shortness of Breath

EXAM:
CHEST  2 VIEW

[Series 1: pa · 0.17mm/px · 2 of 2 slices shown]
[im 1/2]
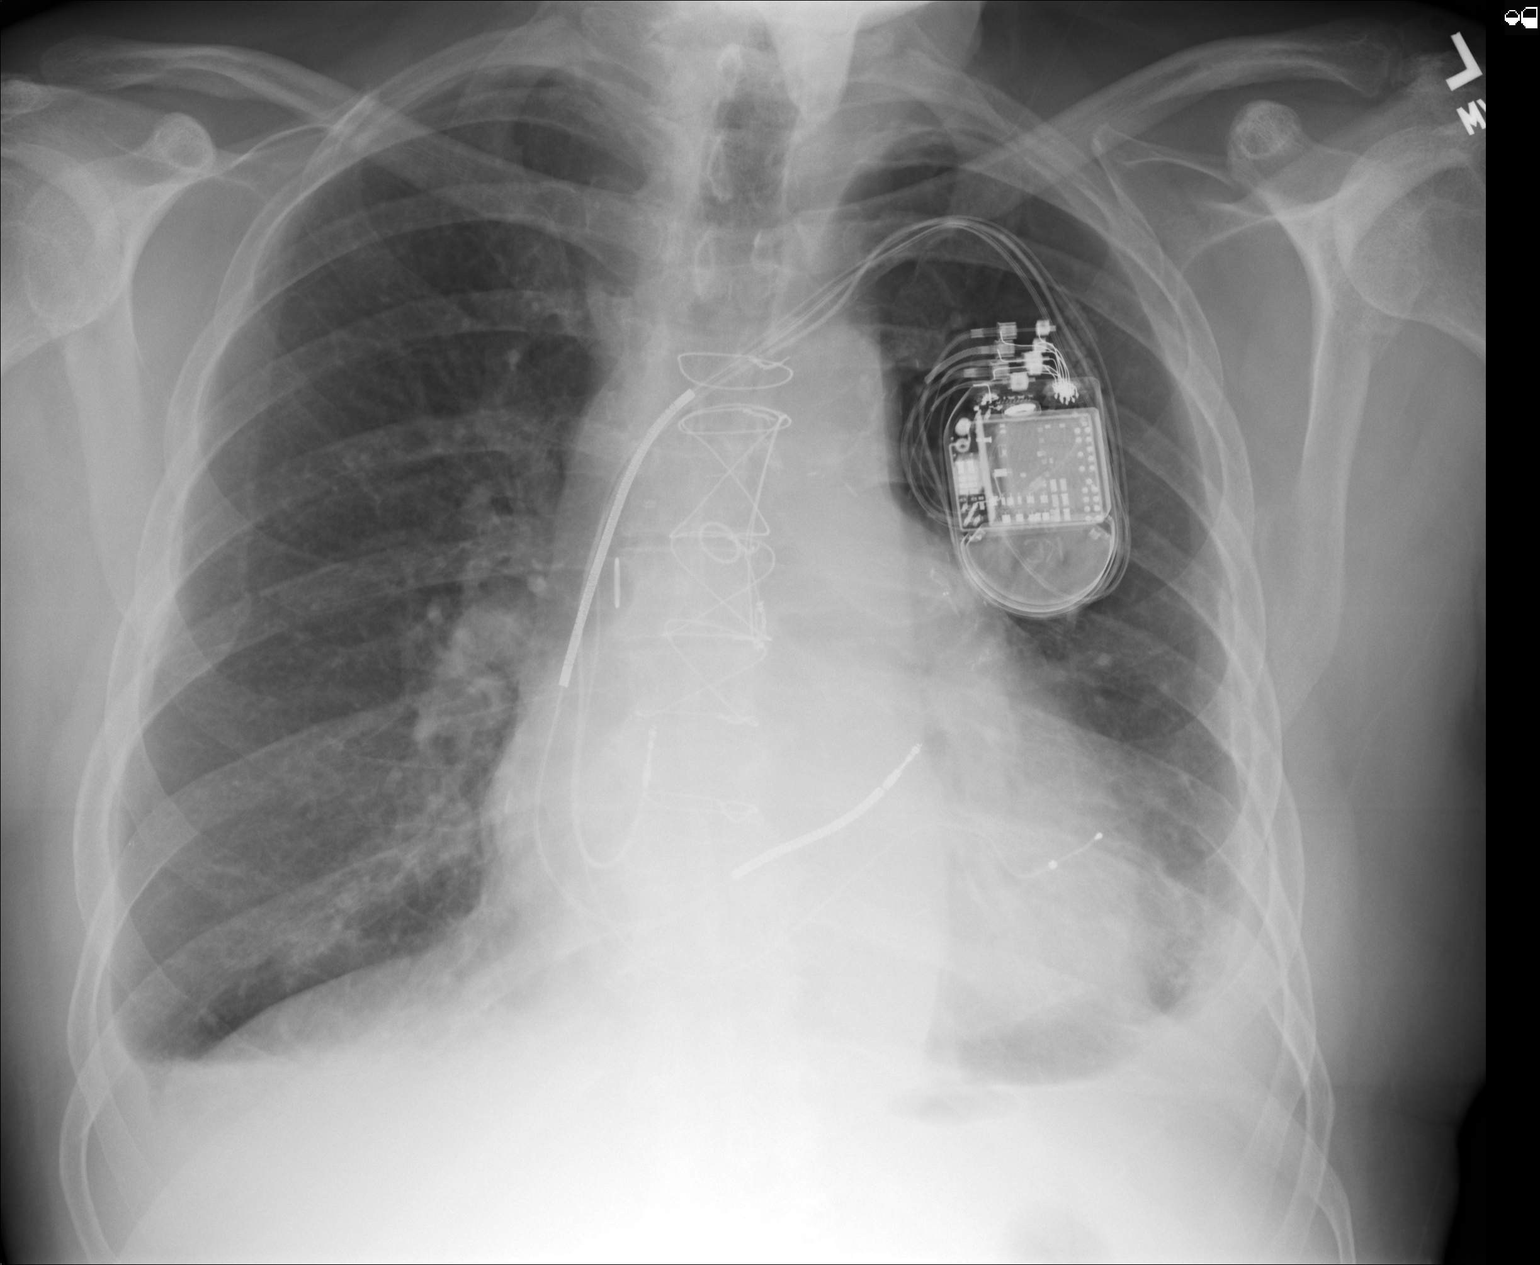
[im 2/2]
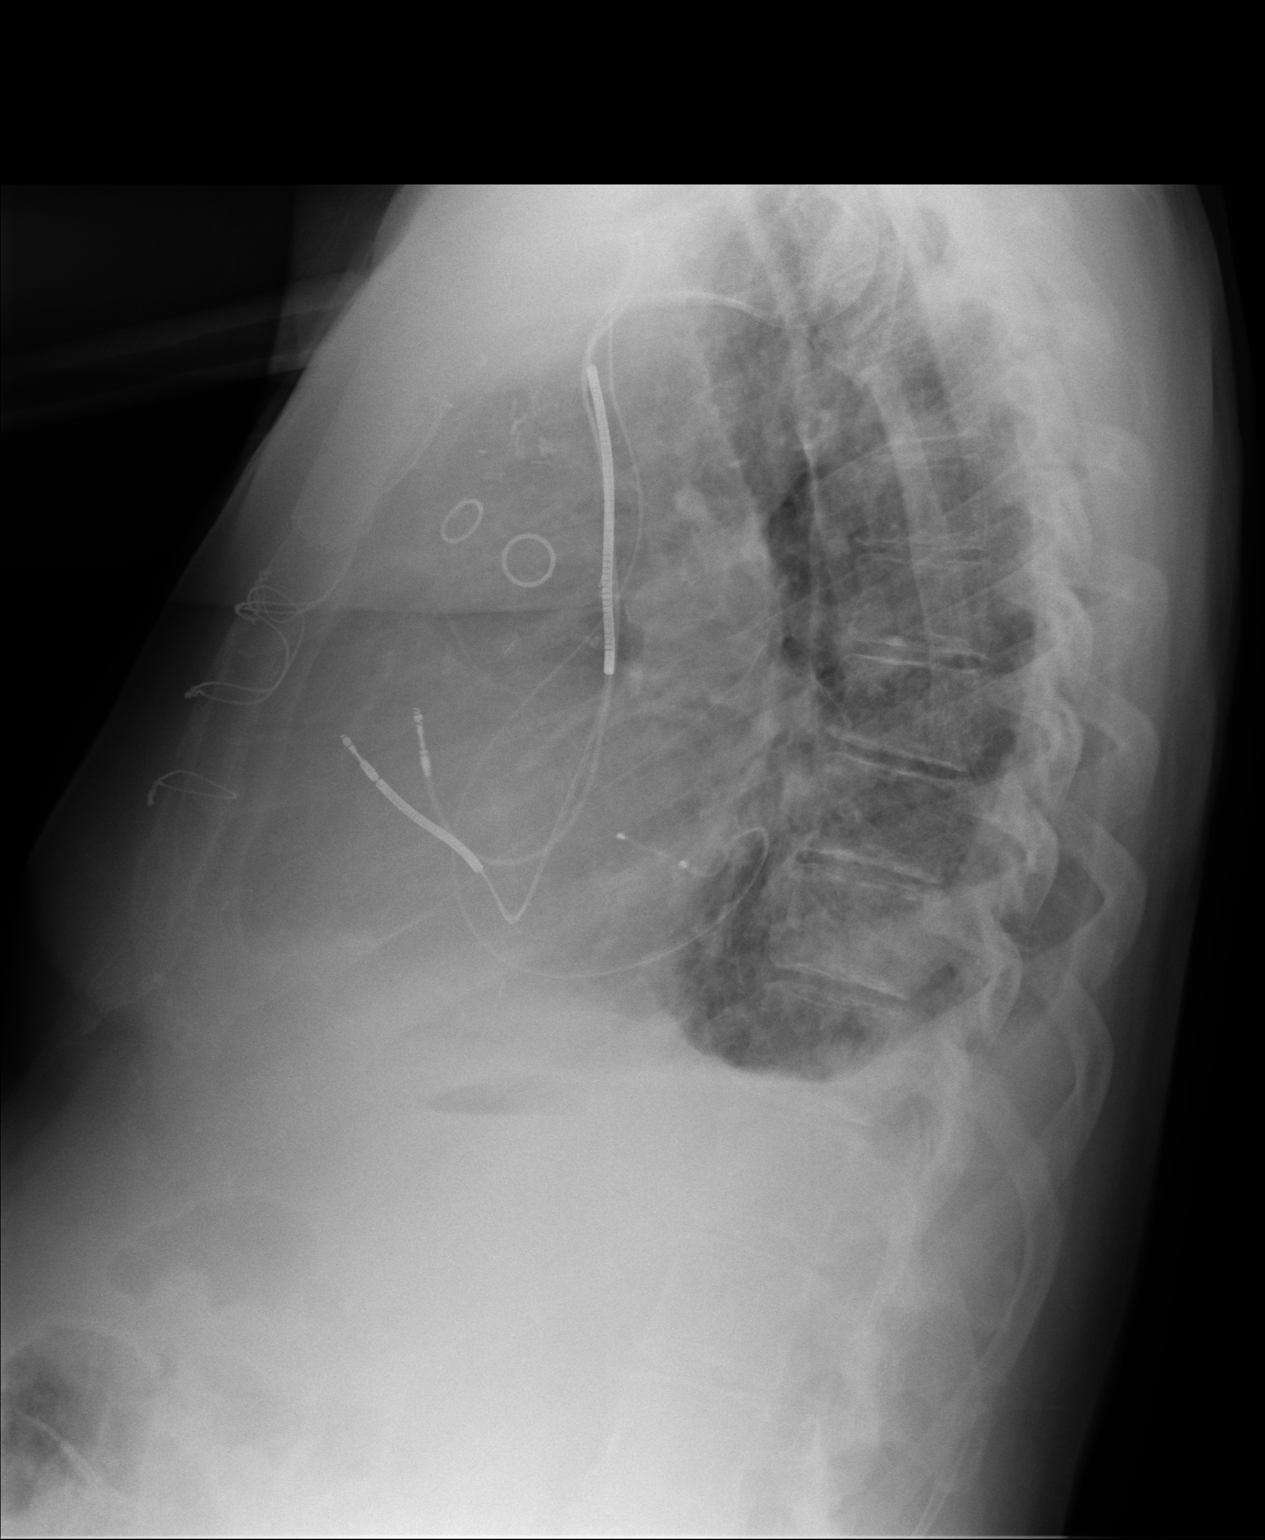

[2 of 2 positions shown; findings below may reference images not displayed]

FINDINGS: Cardiac shadow remains enlarged. Postsurgical changes as well as a
defibrillator are again seen and stable. Chronic blunting of the
costophrenic angles is noted. No vascular congestion is seen no
focal infiltrates are noted.
IMPRESSION: Chronic pleural effusions without acute abnormality.

## 2016-12-19 IMAGING — CR DG CHEST 2V
2 series · 2 of 2 positions shown · non-contrast
Comparison: 08/20/2014

CLINICAL DATA: CHF, COPD.  Atrial fib.

EXAM:
CHEST  2 VIEW

[chest pa]
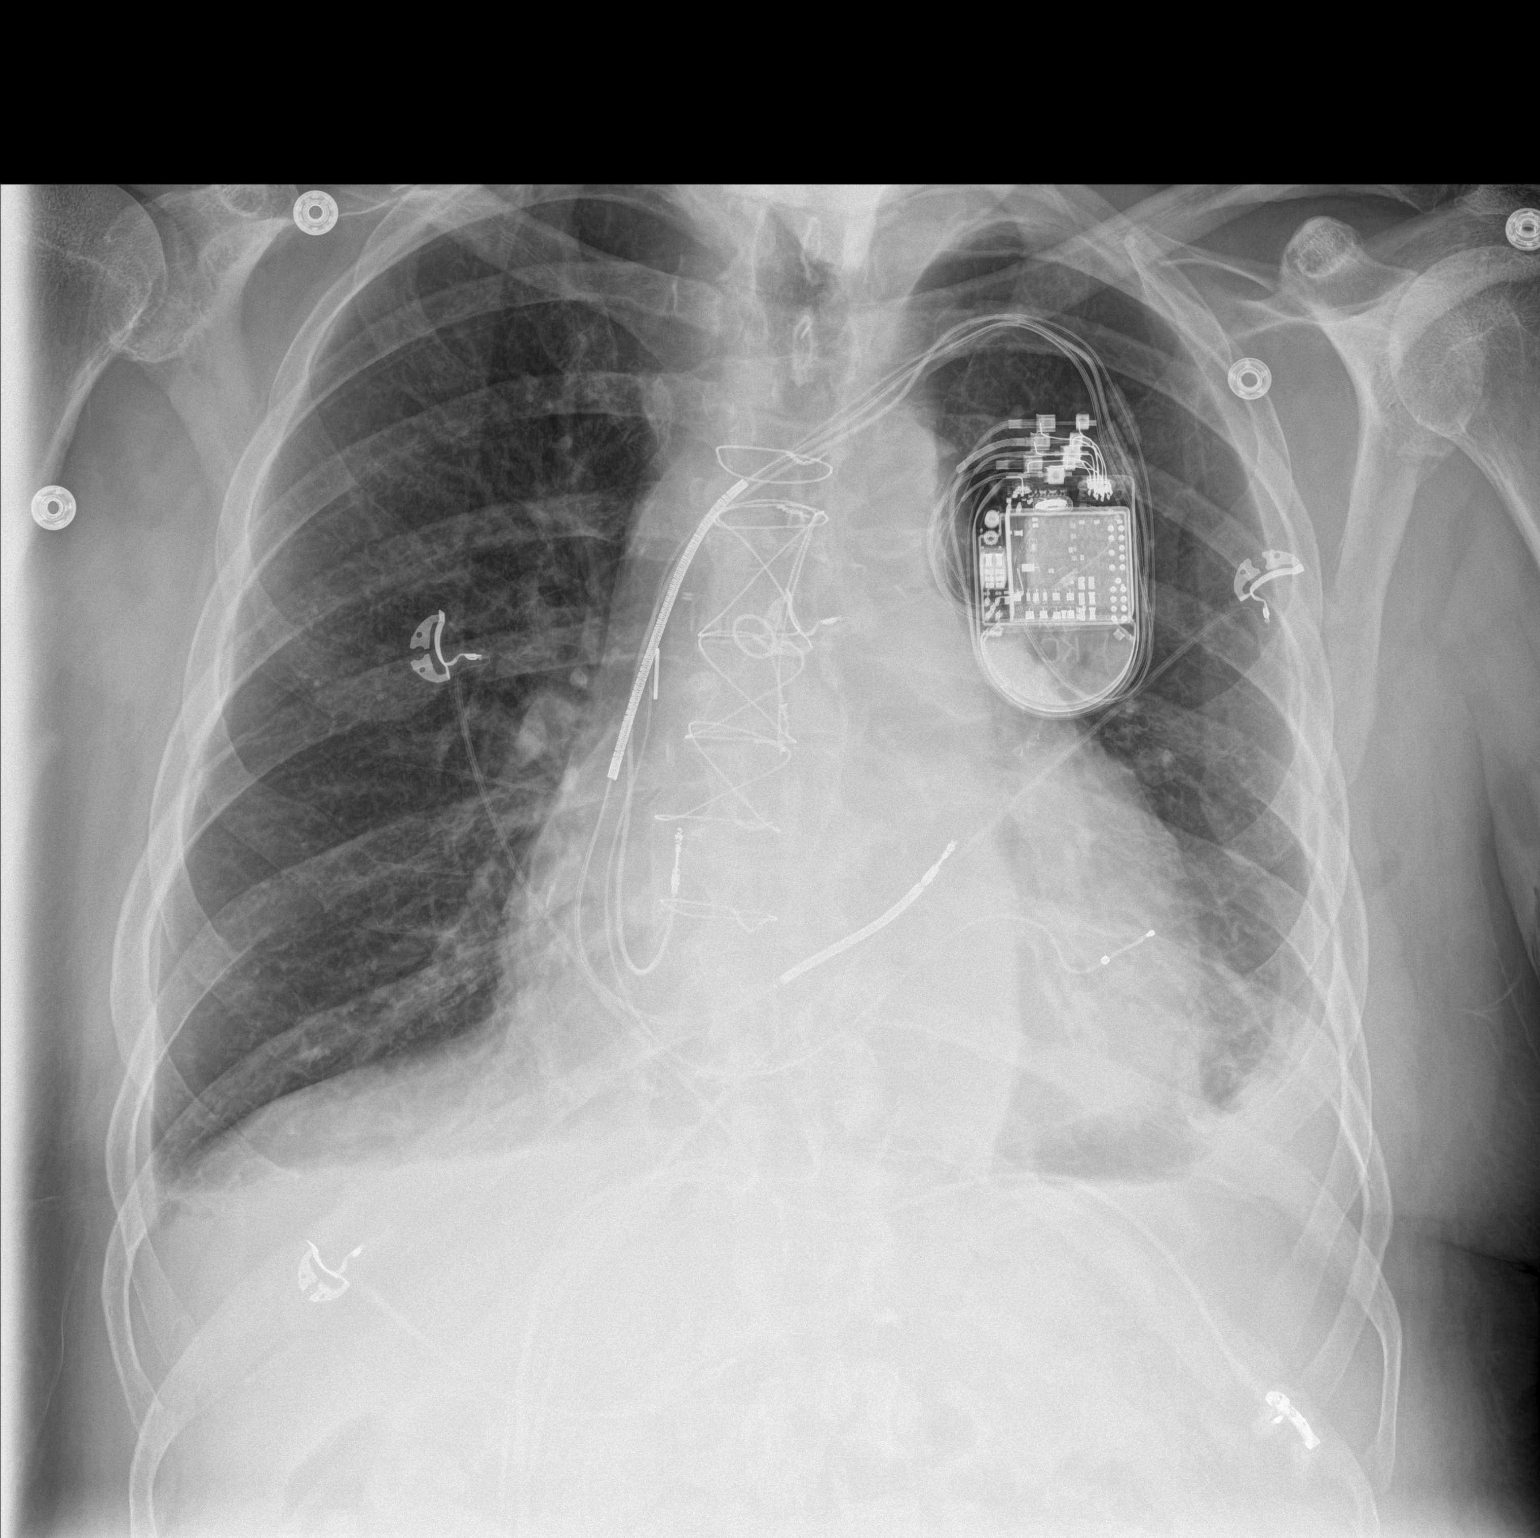

[chest lat]
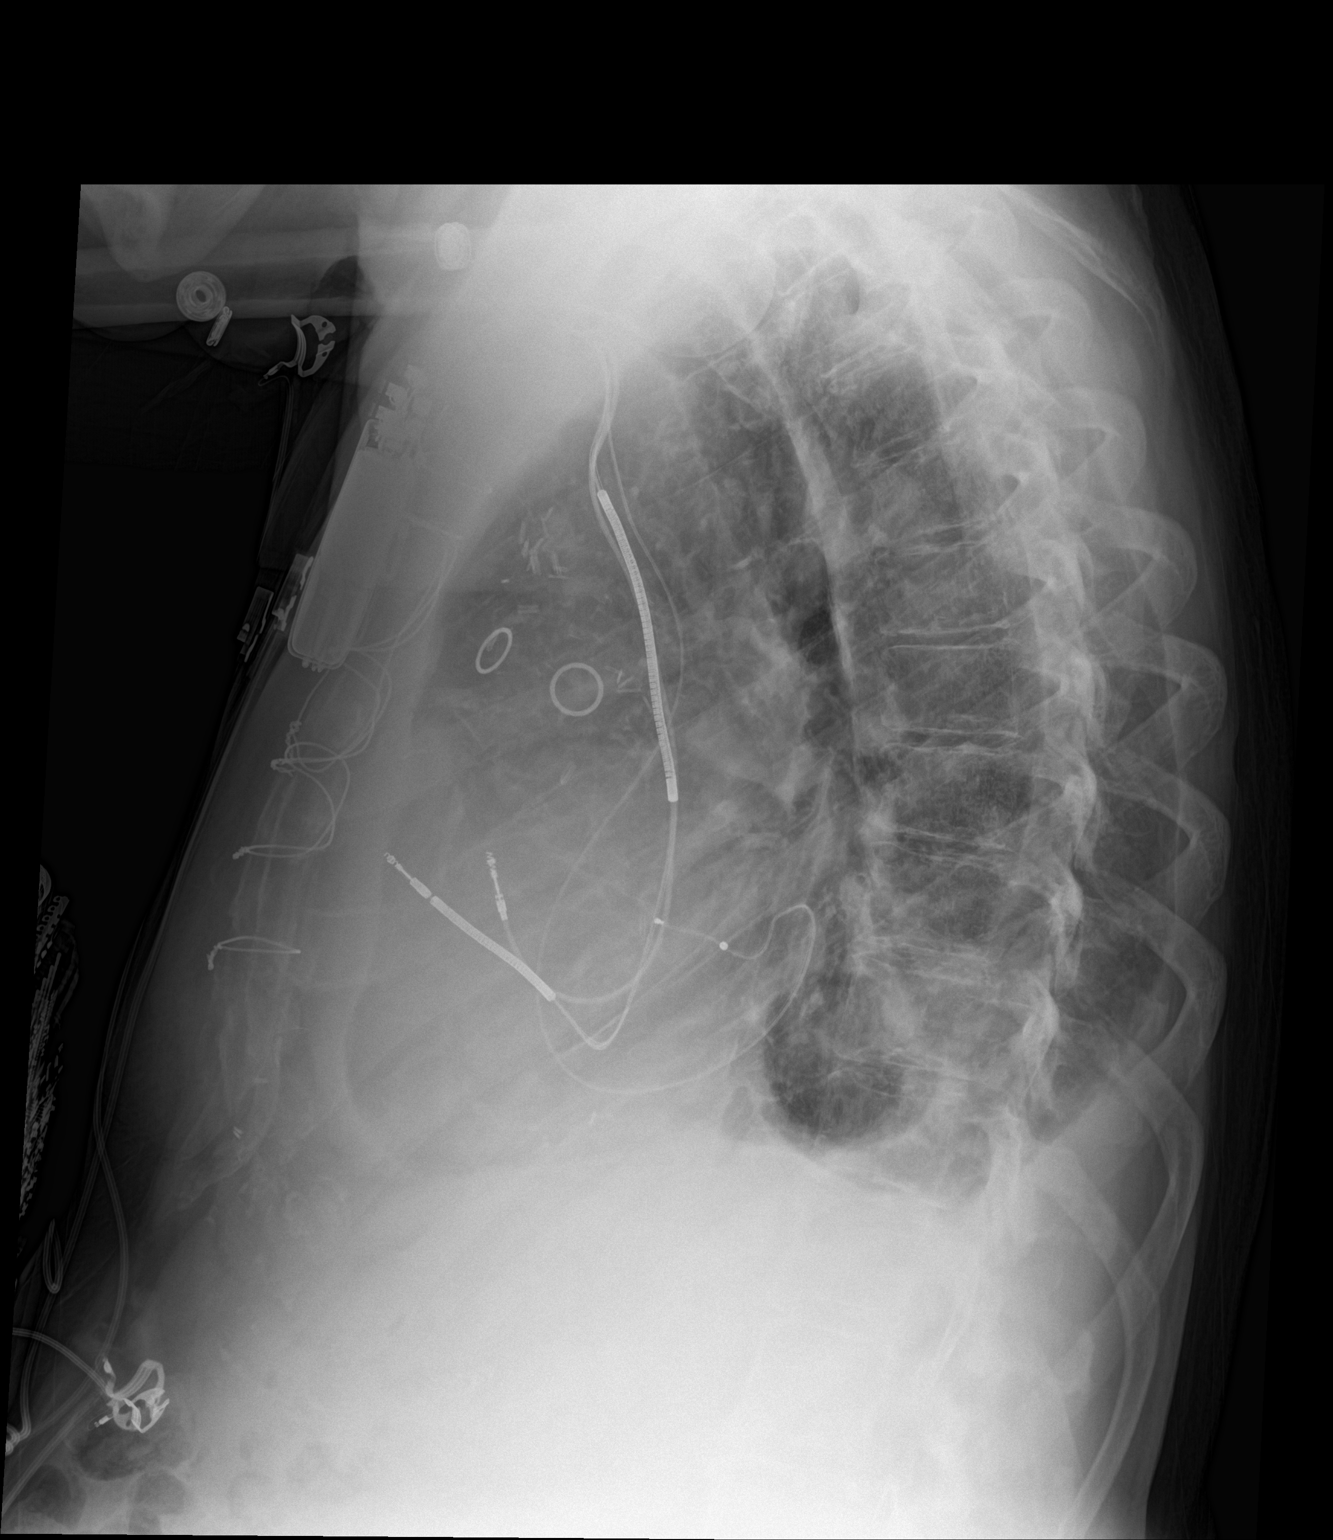

[2 of 2 positions shown; findings below may reference images not displayed]

FINDINGS: Left defibrillator remains in place, unchanged. Prior CABG.
Cardiomegaly. Small bilateral pleural effusions are stable since
prior study. Left basilar scarring or chronic atelectasis. Minimal
right base atelectasis. No overt edema.
IMPRESSION: Stable chronic small bilateral pleural effusions, left greater than
right with chronic left base atelectasis or scarring and minimal
right base atelectasis.

Cardiomegaly.

## 2018-08-23 ENCOUNTER — Other Ambulatory Visit: Payer: Self-pay
